# Patient Record
Sex: Female | Born: 1990
Health system: Southern US, Community
[De-identification: ages and names within clinical notes are randomized; demographics above are authoritative.]

## PROBLEM LIST (undated history)

## (undated) DIAGNOSIS — I10 Essential (primary) hypertension: Secondary | ICD-10-CM

## (undated) DIAGNOSIS — I951 Orthostatic hypotension: Secondary | ICD-10-CM

## (undated) DIAGNOSIS — E11649 Type 2 diabetes mellitus with hypoglycemia without coma: Secondary | ICD-10-CM

## (undated) DIAGNOSIS — E109 Type 1 diabetes mellitus without complications: Secondary | ICD-10-CM

## (undated) DIAGNOSIS — E049 Nontoxic goiter, unspecified: Secondary | ICD-10-CM

## (undated) DIAGNOSIS — E063 Autoimmune thyroiditis: Secondary | ICD-10-CM

## (undated) DIAGNOSIS — E1042 Type 1 diabetes mellitus with diabetic polyneuropathy: Secondary | ICD-10-CM

## (undated) DIAGNOSIS — G930 Cerebral cysts: Secondary | ICD-10-CM

## (undated) HISTORY — PX: OTHER SURGICAL HISTORY: SHX169

## (undated) HISTORY — DX: Cerebral cysts: G93.0

## (undated) HISTORY — DX: Nontoxic goiter, unspecified: E04.9

## (undated) HISTORY — DX: Type 1 diabetes mellitus without complications: E10.9

## (undated) HISTORY — DX: Type 2 diabetes mellitus with hypoglycemia without coma: E11.649

## (undated) HISTORY — DX: Autoimmune thyroiditis: E06.3

## (undated) HISTORY — DX: Essential (primary) hypertension: I10

## (undated) HISTORY — DX: Orthostatic hypotension: I95.1

## (undated) HISTORY — DX: Type 1 diabetes mellitus with diabetic polyneuropathy: E10.42

---
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
## 2010-05-03 ENCOUNTER — Ambulatory Visit: Payer: Self-pay | Admitting: "Endocrinology

## 2010-07-09 IMAGING — CT CT ABD-PELV W/ CM
2 of 4 series · 16 of 46 positions shown, 18 images · IV contrast (APPLIED)
Comparison: None.

CLINICAL DATA: Right lower quadrant abdominal pain with nausea for
1 day.

CT ABDOMEN AND PELVIS WITH CONTRAST
TECHNIQUE: Multidetector CT imaging of the abdomen and pelvis was
performed following the standard protocol during bolus
administration of intravenous contrast.
Contrast: 100 ml Cmnipaque-GRR intravenously.

[Series 2: abd/pelvis 5.0 b31f · axial · 0.63mm/px · z∈[+826,+1250]mm · 13 of 93 slices shown, 15 images]
[im 4/93  soft-tissue]
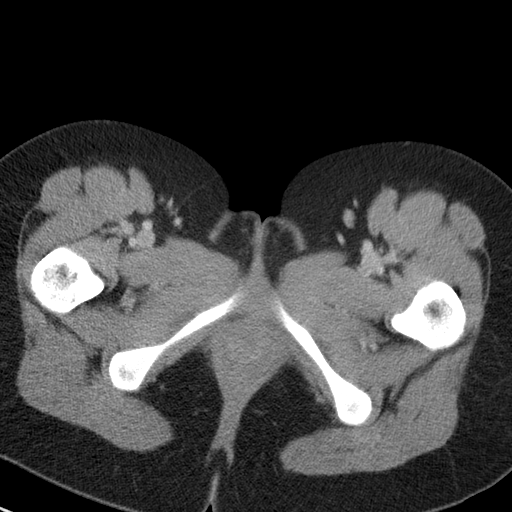
[im 4/93  bone]
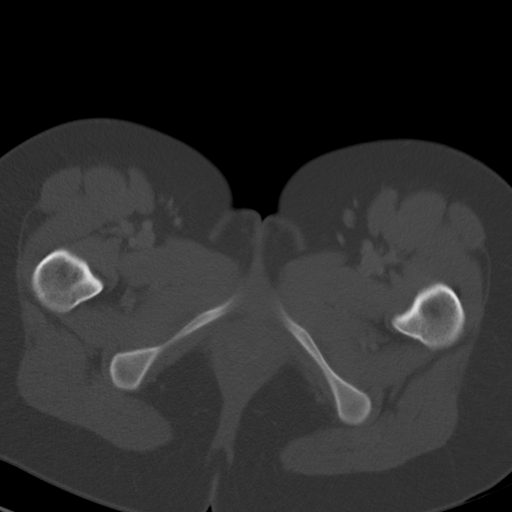
[im 11/93  soft-tissue]
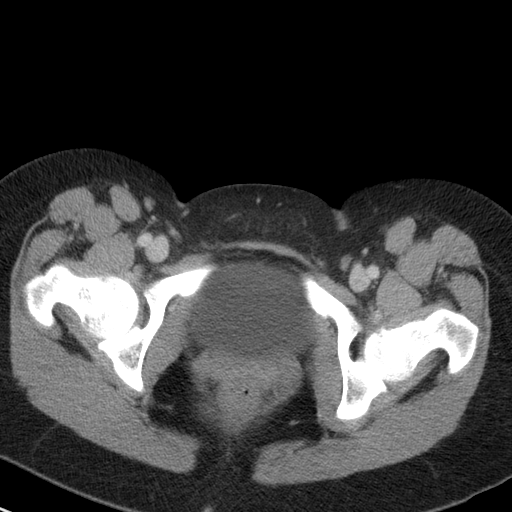
[im 18/93  soft-tissue]
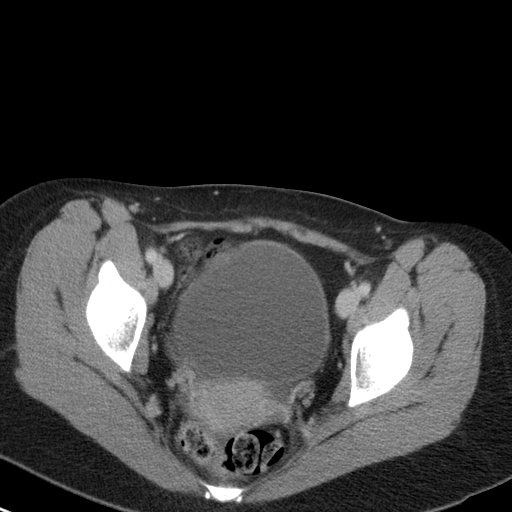
[im 25/93  soft-tissue]
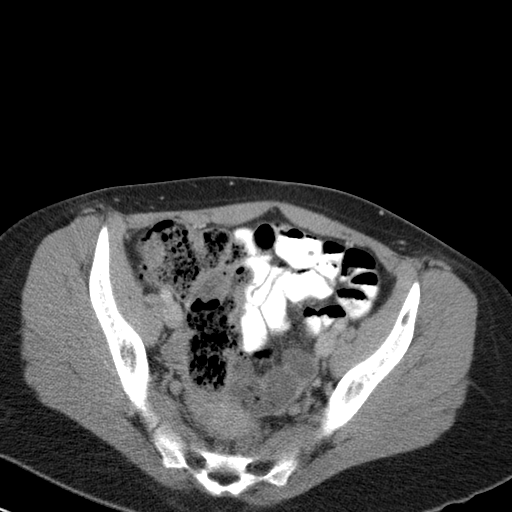
[im 32/93  soft-tissue]
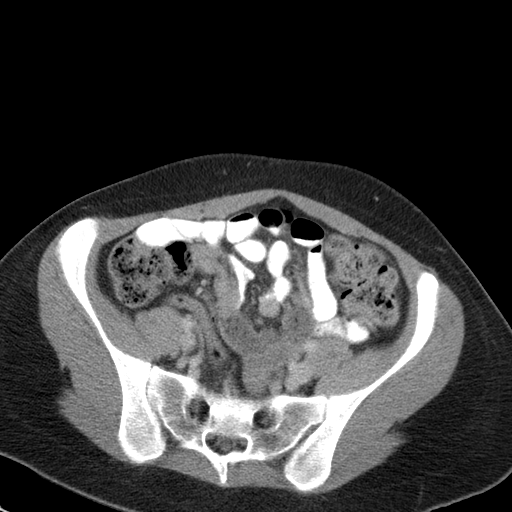
[im 39/93  soft-tissue]
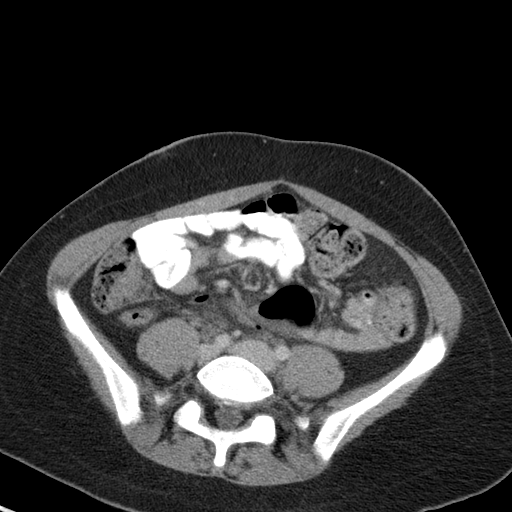
[im 47/93  soft-tissue]
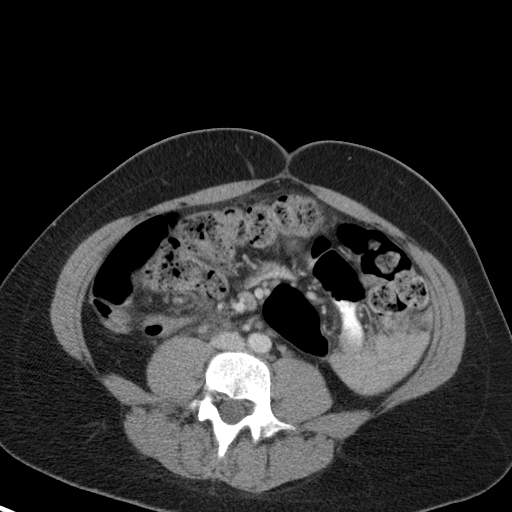
[im 54/93  soft-tissue]
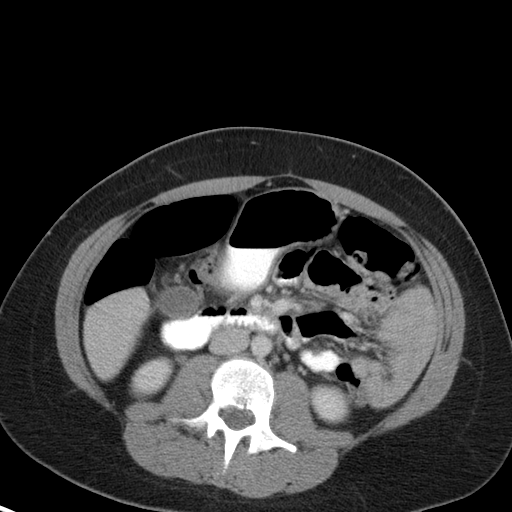
[im 61/93  soft-tissue]
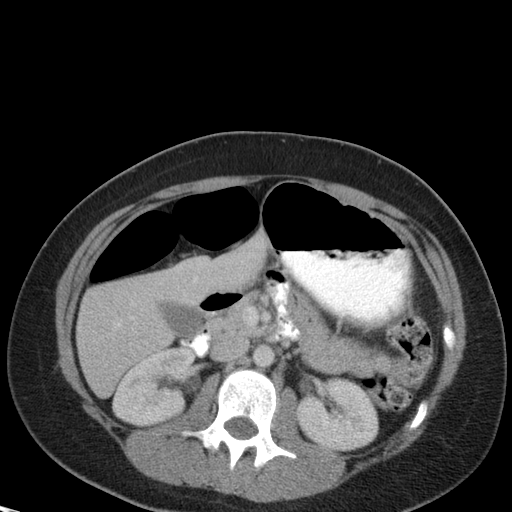
[im 61/93  bone]
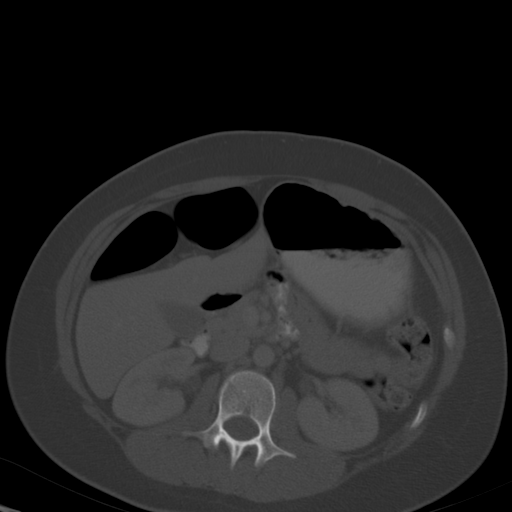
[im 68/93  soft-tissue]
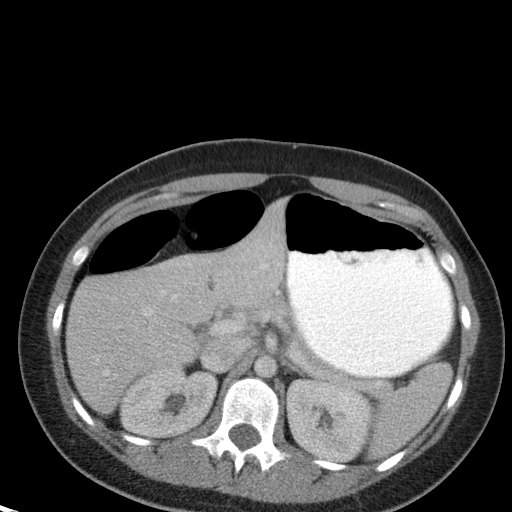
[im 75/93  soft-tissue]
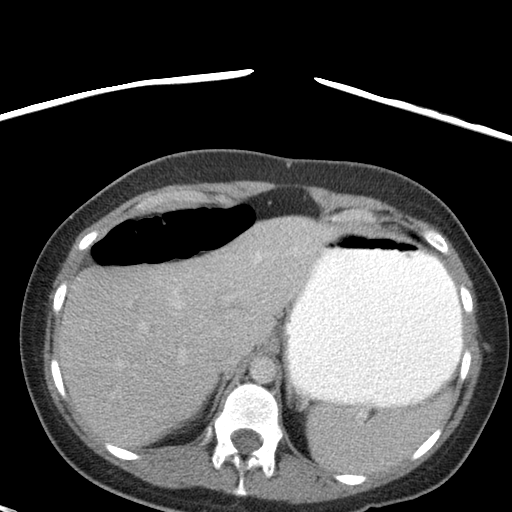
[im 82/93  soft-tissue]
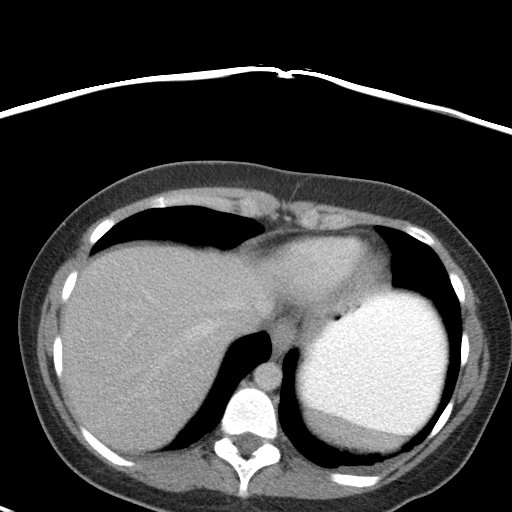
[im 89/93  soft-tissue]
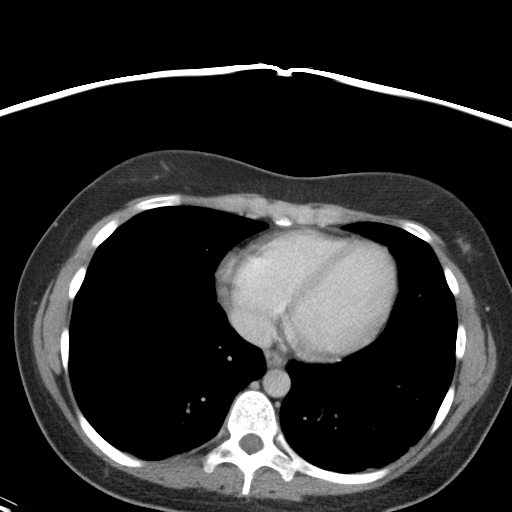

[Series 5: abd/pelvis 3.0 coronal · coronal · 0.70mm/px · 3 of 70 slices shown]
[im 24/70  soft-tissue]
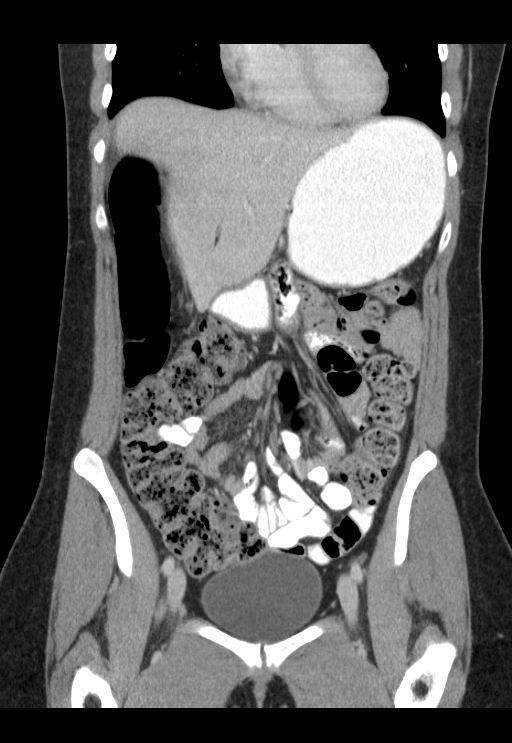
[im 31/70  soft-tissue]
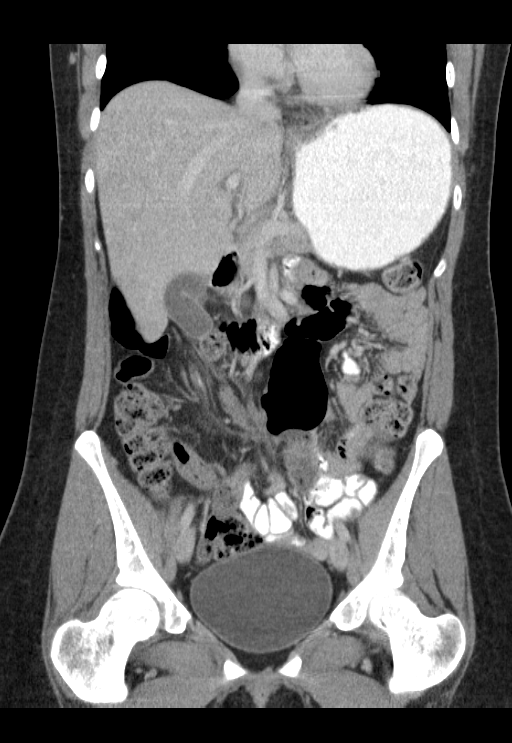
[im 39/70  soft-tissue]
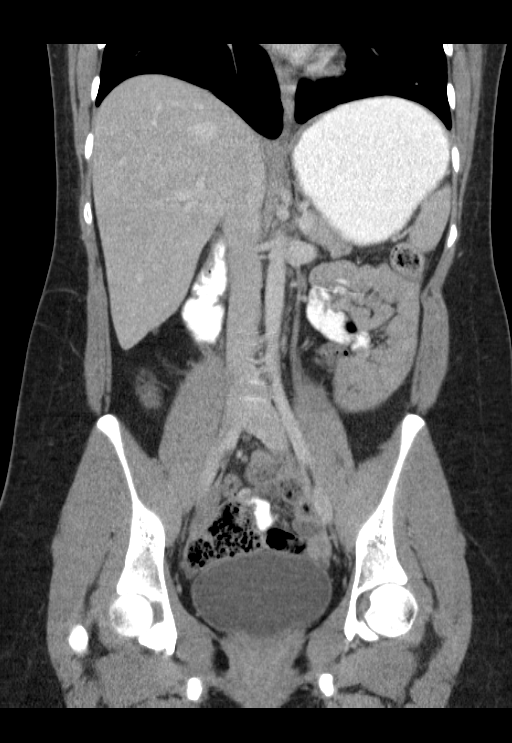

[16 of 46 positions shown; findings below may reference images not displayed]

FINDINGS: The lung bases are clear.  There is no pleural effusion.
The liver, spleen, gallbladder, pancreas and adrenal glands appear
unremarkable.

No renal or ureteral calculi are demonstrated.  There is probable
mild upper pole caliectasis on the right and the mid ureter is
mildly prominent without definite signs of obstruction.  There is
no periureteral inflammatory change.  The urinary bladder is
unremarkable in appearance.

Moderate stool is noted throughout the colon.  The appendix is air-
filled and normal in caliber without surrounding inflammation.  The
uterus and ovaries appear normal.
IMPRESSION: 1.  No evidence of appendicitis or other abdominal pelvic
inflammatory process.
2.  Slight prominence of the right ureter and right upper pole
calices may be physiologic.  No urinary tract calculi are
demonstrated.
3.  Moderate stool throughout the colon suggests constipation.

## 2010-07-27 LAB — URINALYSIS, ROUTINE W REFLEX MICROSCOPIC
Bilirubin Urine: NEGATIVE
Hgb urine dipstick: NEGATIVE
Nitrite: NEGATIVE
Protein, ur: NEGATIVE mg/dL
Specific Gravity, Urine: 1.029 (ref 1.005–1.030)
Urobilinogen, UA: 0.2 mg/dL (ref 0.0–1.0)

## 2010-07-27 LAB — BASIC METABOLIC PANEL
Calcium: 8.9 mg/dL (ref 8.4–10.5)
Creatinine, Ser: 0.8 mg/dL (ref 0.4–1.2)
GFR calc Af Amer: >60 mL/min (ref >60)
GFR calc non Af Amer: >60 mL/min (ref >60)
Sodium: 139 mEq/L (ref 135–145)

## 2010-07-27 LAB — CBC
Platelets: 325 10*3/uL (ref 150–400)
RBC: 4.02 MIL/uL (ref 3.87–5.11)
WBC: 6.3 10*3/uL (ref 4.0–10.5)

## 2010-07-27 LAB — DIFFERENTIAL
Eosinophils Absolute: 0.1 10*3/uL (ref 0.0–0.7)
Lymphocytes Relative: 26 % (ref 12–46)
Lymphs Abs: 1.6 10*3/uL (ref 0.7–4.0)
Neutro Abs: 4.2 10*3/uL (ref 1.7–7.7)
Neutrophils Relative %: 65 % (ref 43–77)

## 2010-07-27 LAB — PREGNANCY, URINE: Preg Test, Ur: NEGATIVE

## 2010-07-27 LAB — GC/CHLAMYDIA PROBE AMP, GENITAL
Chlamydia, DNA Probe: NEGATIVE
GC Probe Amp, Genital: NEGATIVE

## 2010-07-27 LAB — WET PREP, GENITAL: Yeast Wet Prep HPF POC: NONE SEEN

## 2010-07-30 LAB — URINALYSIS, ROUTINE W REFLEX MICROSCOPIC
Hgb urine dipstick: NEGATIVE
Protein, ur: NEGATIVE mg/dL
Urobilinogen, UA: 0.2 mg/dL (ref 0.0–1.0)

## 2010-07-30 LAB — COMPREHENSIVE METABOLIC PANEL
AST: 24 U/L (ref 0–37)
Albumin: 3.7 g/dL (ref 3.5–5.2)
Calcium: 9.3 mg/dL (ref 8.4–10.5)
Chloride: 105 mEq/L (ref 96–112)
Creatinine, Ser: 0.7 mg/dL (ref 0.4–1.2)
GFR calc Af Amer: >60 mL/min (ref >60)

## 2010-07-30 LAB — GLUCOSE, CAPILLARY: Glucose-Capillary: 126 mg/dL — ABNORMAL HIGH (ref 70–99)

## 2010-07-30 LAB — DIFFERENTIAL
Basophils Relative: 1 % (ref 0–1)
Eosinophils Relative: 1 % (ref 0–5)
Lymphocytes Relative: 19 % (ref 12–46)
Lymphs Abs: 1.3 10*3/uL (ref 0.7–4.0)
Monocytes Absolute: 0.2 10*3/uL (ref 0.1–1.0)
Neutro Abs: 5.1 10*3/uL (ref 1.7–7.7)
Neutrophils Relative %: 76 % (ref 43–77)

## 2010-07-30 LAB — CBC
MCV: 87.3 fL (ref 78.0–100.0)
Platelets: 327 10*3/uL (ref 150–400)
WBC: 6.7 10*3/uL (ref 4.0–10.5)

## 2010-07-30 LAB — PREGNANCY, URINE: Preg Test, Ur: NEGATIVE

## 2010-07-30 LAB — GC/CHLAMYDIA PROBE AMP, GENITAL
Chlamydia, DNA Probe: NEGATIVE
GC Probe Amp, Genital: NEGATIVE

## 2010-07-30 LAB — WET PREP, GENITAL
Clue Cells Wet Prep HPF POC: NONE SEEN
Trich, Wet Prep: NONE SEEN

## 2010-08-21 ENCOUNTER — Other Ambulatory Visit: Payer: Self-pay | Admitting: *Deleted

## 2010-08-21 ENCOUNTER — Encounter: Payer: Self-pay | Admitting: *Deleted

## 2010-09-11 IMAGING — US US PELVIS COMPLETE
1 series · 14 of 15 positions shown · non-contrast
Comparison: None.

CLINICAL DATA: History of right-sided pain.  History of diabetes.

TRANSABDOMINAL ULTRASOUND OF PELVIS
TECHNIQUE: Transabdominal ultrasound examination of the pelvis was
performed including evaluation of the uterus, ovaries, adnexal
regions, and pelvic cul-de-sac.

[Series 1: us pelvis complete · 0.30mm/px · 14 of 15 slices shown]
[im 1/15]
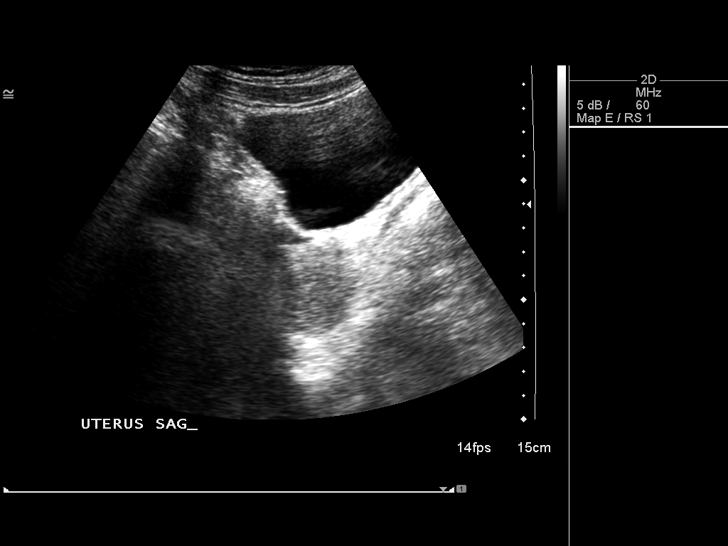
[im 2/15]
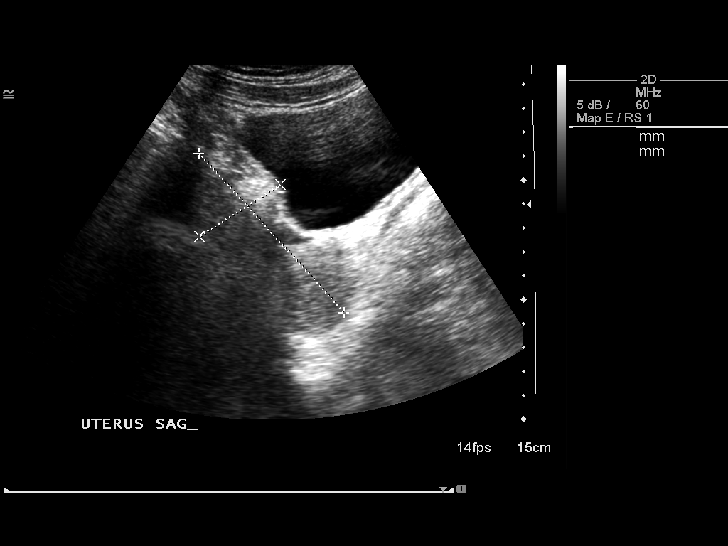
[im 3/15]
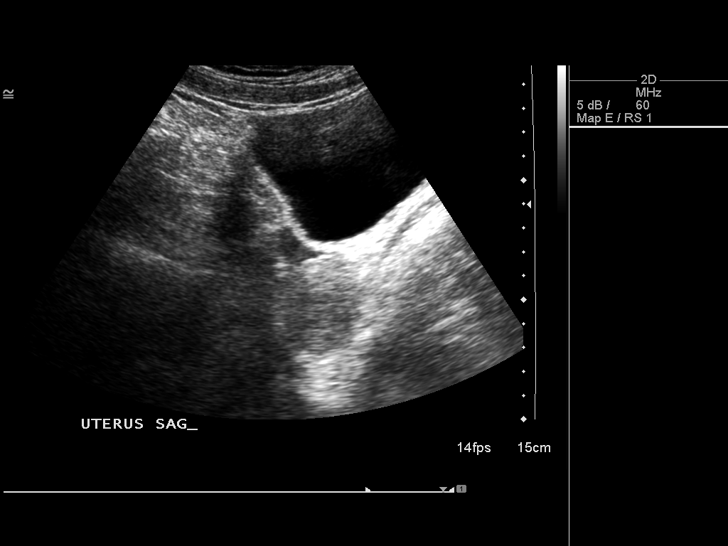
[im 4/15]
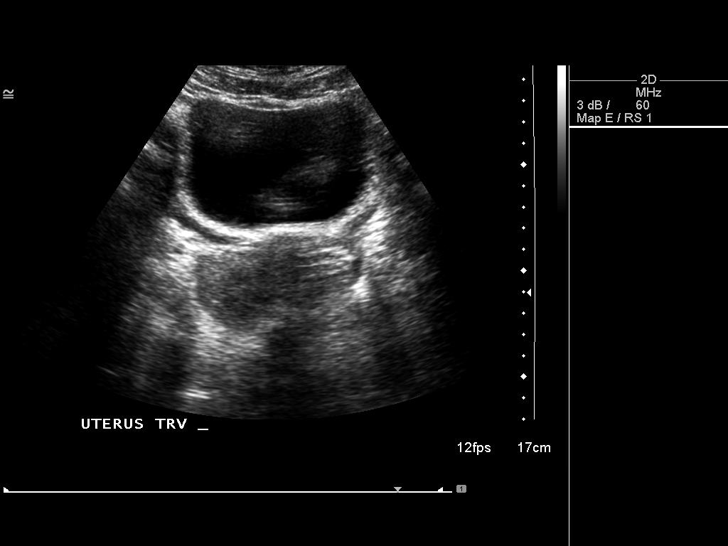
[im 5/15]
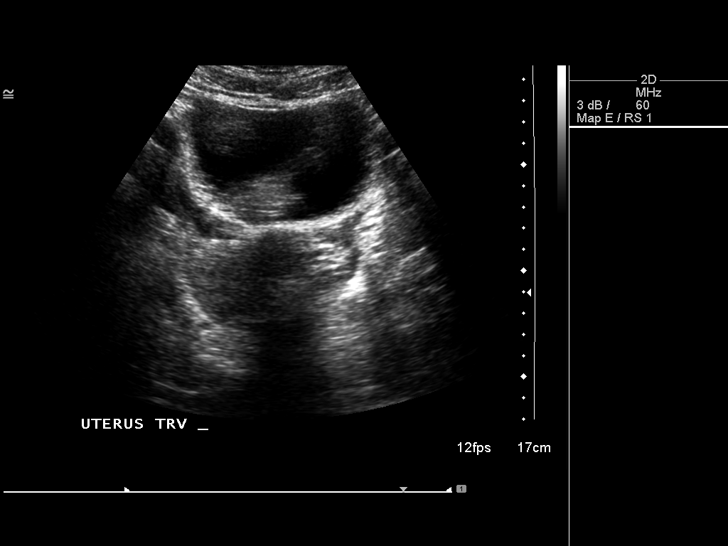
[im 6/15]
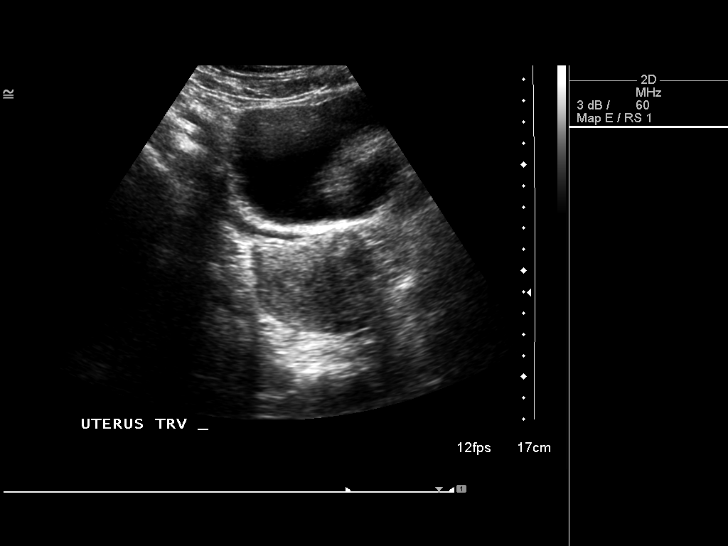
[im 7/15]
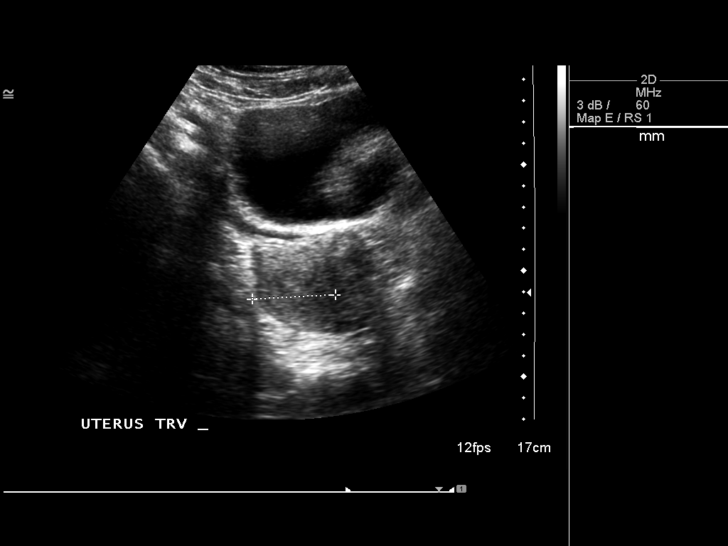
[im 9/15]
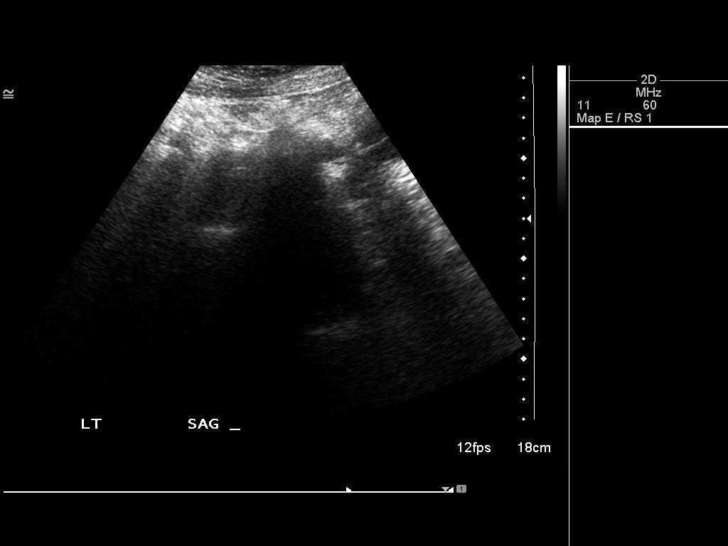
[im 10/15]
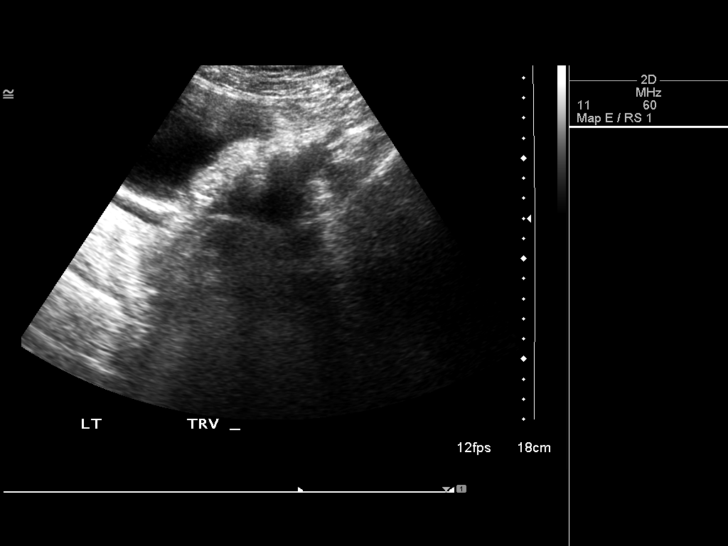
[im 11/15]
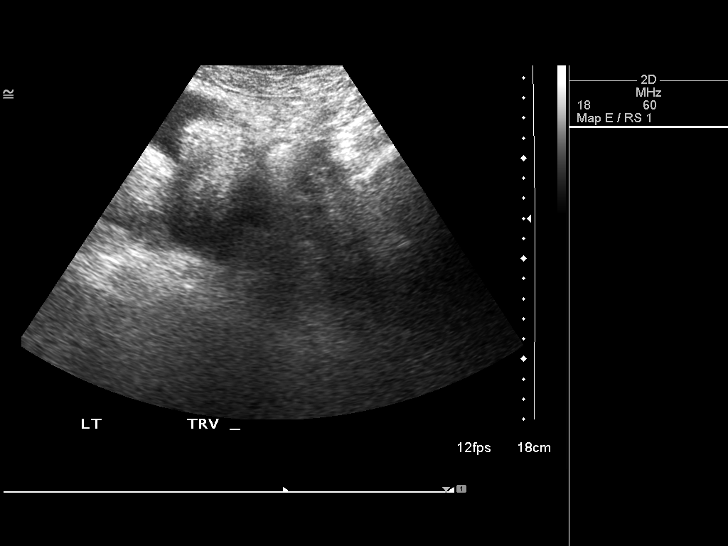
[im 12/15]
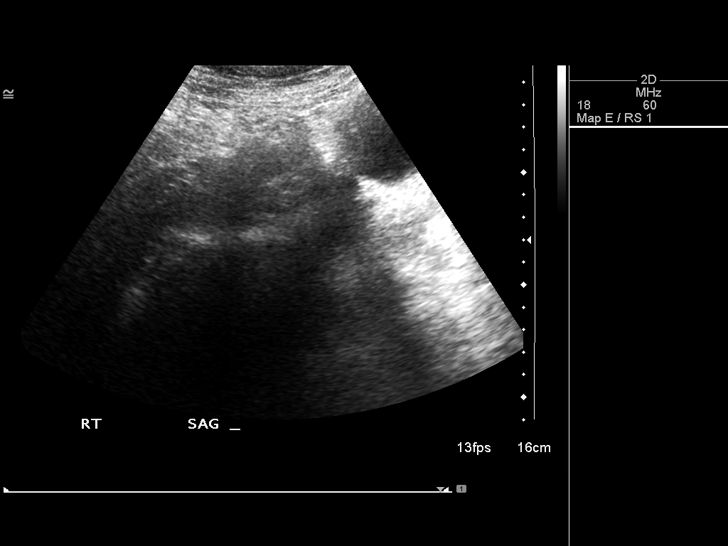
[im 13/15]
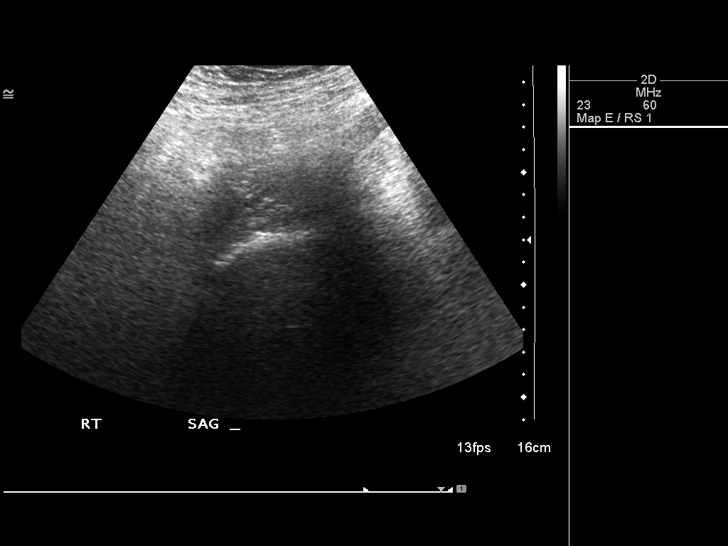
[im 14/15]
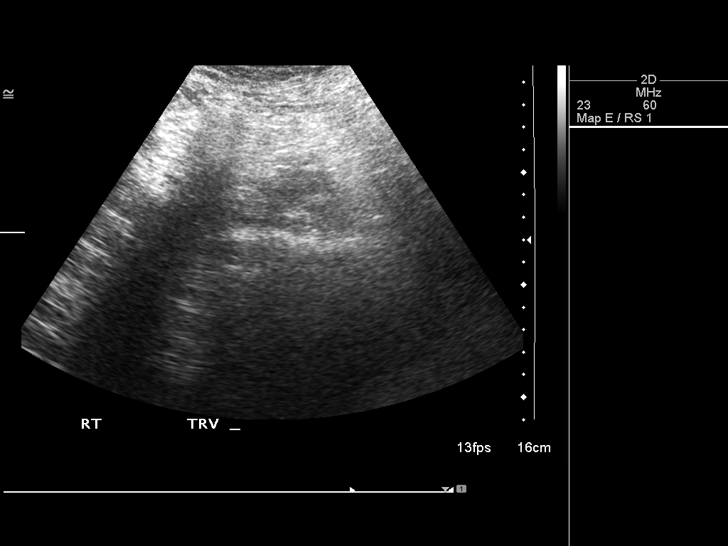
[im 15/15]
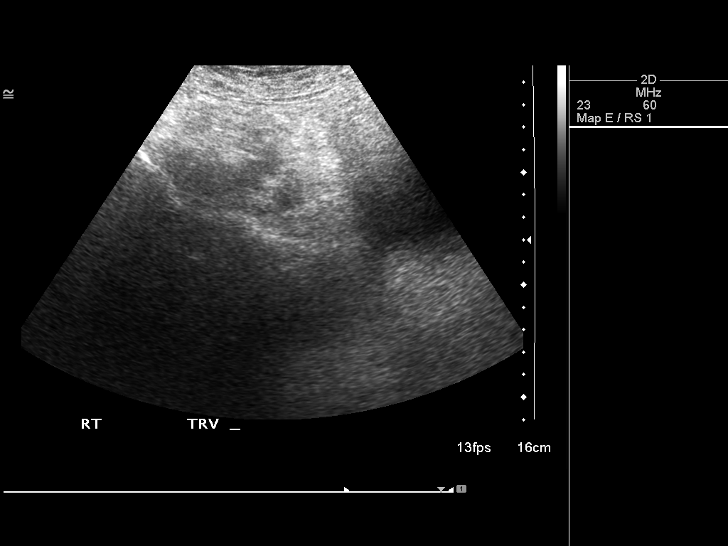

[14 of 15 positions shown; findings below may reference images not displayed]

FINDINGS: Study was compromised by fecal material and bowel gas.

Uterus measures 9.0 x 4.0 x 3.9 cm.  No myometrial lesion was
demonstrated.

Endometrium :  Endometrial stripe cannot be visualized.  It does
not appear thickened.

Right Ovary :  Right ovary cannot be imaged.

Left Ovary :  Left ovary cannot be imaged.

Other Findings:  No free fluid is seen in the pelvis.
IMPRESSION: Examination was compromised by bowel gas and fecal material.  No
pelvic pathology was demonstrated.  The ovaries cannot be imaged.

## 2010-09-17 ENCOUNTER — Ambulatory Visit (INDEPENDENT_AMBULATORY_CARE_PROVIDER_SITE_OTHER): Payer: BC Managed Care – PPO | Admitting: "Endocrinology

## 2010-09-17 DIAGNOSIS — E1142 Type 2 diabetes mellitus with diabetic polyneuropathy: Secondary | ICD-10-CM

## 2010-09-17 DIAGNOSIS — IMO0002 Reserved for concepts with insufficient information to code with codable children: Secondary | ICD-10-CM

## 2010-09-17 DIAGNOSIS — E1065 Type 1 diabetes mellitus with hyperglycemia: Secondary | ICD-10-CM

## 2010-09-17 DIAGNOSIS — I10 Essential (primary) hypertension: Secondary | ICD-10-CM

## 2010-09-17 DIAGNOSIS — E785 Hyperlipidemia, unspecified: Secondary | ICD-10-CM

## 2010-09-20 ENCOUNTER — Other Ambulatory Visit: Payer: Self-pay | Admitting: "Endocrinology

## 2010-09-20 LAB — COMPREHENSIVE METABOLIC PANEL
AST: 14 U/L (ref 0–37)
Albumin: 3.9 g/dL (ref 3.5–5.2)
Alkaline Phosphatase: 81 U/L (ref 39–117)
BUN: 14 mg/dL (ref 6–23)
Potassium: 4.3 mEq/L (ref 3.5–5.3)
Sodium: 139 mEq/L (ref 135–145)
Total Protein: 7.3 g/dL (ref 6.0–8.3)

## 2010-09-20 LAB — LIPID PANEL
HDL: 76 mg/dL (ref >39)
LDL Cholesterol: 123 mg/dL — ABNORMAL HIGH (ref 0–99)
VLDL: 41 mg/dL — ABNORMAL HIGH (ref 0–40)

## 2010-09-21 LAB — MICROALBUMIN / CREATININE URINE RATIO
Creatinine, Urine: 246.9 mg/dL
Microalb, Ur: 0.88 mg/dL (ref 0.00–1.89)

## 2010-09-21 LAB — THYROID PEROXIDASE ANTIBODY: Thyroperoxidase Ab SerPl-aCnc: 34.9 IU/mL (ref <35.0)

## 2010-09-27 ENCOUNTER — Telehealth: Payer: Self-pay | Admitting: *Deleted

## 2010-09-27 NOTE — Telephone Encounter (Signed)
Lab results per Dr. Fransico Michael: 1.   TFT levels have been bouncing around from low to normal to high normal.  This is consistent with evolving Hashimoto's Disease.  Brief explanation given.  https://www.west.net/  Best website to look it up. 2.   Total Cholesterol 240;  Up from 178 in 2010. 3.   Triglycerides 206 still high but down from 241 in 2010. 4.   HDL  76 normal;  71 in 2010 5.   LDL 123; up from 97 in 2010; needs to be < 100. 6.   Urine Protein is normal. 7.   Previously Victoria Atkinson was not exercising, but getting ready to start.    8.   She needs to increase her exercise, or she'll need to start cholesterol lowering meds. 9.   If questions, please feel free to contact Dr. Fransico Michael next week.   Leave him a voice mail. 10. Follow-up as planned.

## 2011-04-09 ENCOUNTER — Other Ambulatory Visit: Payer: Self-pay | Admitting: *Deleted

## 2011-04-09 DIAGNOSIS — IMO0002 Reserved for concepts with insufficient information to code with codable children: Secondary | ICD-10-CM

## 2011-04-09 DIAGNOSIS — E1065 Type 1 diabetes mellitus with hyperglycemia: Secondary | ICD-10-CM

## 2011-04-17 LAB — T4, FREE: Free T4: 1.16 ng/dL (ref 0.80–1.80)

## 2011-04-17 LAB — LIPID PANEL
LDL Cholesterol: 98 mg/dL (ref 0–99)
Total CHOL/HDL Ratio: 2.9 Ratio

## 2011-04-18 LAB — MICROALBUMIN / CREATININE URINE RATIO
Creatinine, Urine: 456 mg/dL
Microalb, Ur: 0.96 mg/dL (ref 0.00–1.89)

## 2011-04-24 ENCOUNTER — Encounter: Payer: Self-pay | Admitting: "Endocrinology

## 2011-04-24 ENCOUNTER — Ambulatory Visit (INDEPENDENT_AMBULATORY_CARE_PROVIDER_SITE_OTHER): Payer: BC Managed Care – PPO | Admitting: "Endocrinology

## 2011-04-24 VITALS — BP 127/84 | HR 114 | Wt 160.0 lb

## 2011-04-24 DIAGNOSIS — E1169 Type 2 diabetes mellitus with other specified complication: Secondary | ICD-10-CM

## 2011-04-24 DIAGNOSIS — E063 Autoimmune thyroiditis: Secondary | ICD-10-CM

## 2011-04-24 DIAGNOSIS — I10 Essential (primary) hypertension: Secondary | ICD-10-CM

## 2011-04-24 DIAGNOSIS — E1065 Type 1 diabetes mellitus with hyperglycemia: Secondary | ICD-10-CM

## 2011-04-24 DIAGNOSIS — E11649 Type 2 diabetes mellitus with hypoglycemia without coma: Secondary | ICD-10-CM

## 2011-04-24 DIAGNOSIS — E1142 Type 2 diabetes mellitus with diabetic polyneuropathy: Secondary | ICD-10-CM

## 2011-04-24 DIAGNOSIS — E1149 Type 2 diabetes mellitus with other diabetic neurological complication: Secondary | ICD-10-CM

## 2011-04-24 DIAGNOSIS — E049 Nontoxic goiter, unspecified: Secondary | ICD-10-CM

## 2011-04-24 DIAGNOSIS — G609 Hereditary and idiopathic neuropathy, unspecified: Secondary | ICD-10-CM

## 2011-04-24 LAB — POCT GLYCOSYLATED HEMOGLOBIN (HGB A1C): Hemoglobin A1C: 5.8

## 2011-04-24 LAB — GLUCOSE, POCT (MANUAL RESULT ENTRY): POC Glucose: 174

## 2011-04-24 NOTE — Progress Notes (Signed)
Subjective:  Patient Name: Victoria Atkinson Date of Birth: 1991-04-24  MRN: [id number]  Victoria Atkinson  presents to the office today for follow-up of her type 1 diabetes mellitus, goiter, hypoglycemia, peripheral neuropathy, headaches, Hashimoto's disease, and hypertension.  HISTORY OF PRESENT ILLNESS:   Victoria Atkinson is a 20 y.o. Caucasian young woman.  Victoria Atkinson was unaccompanied.   1. The patient was first referred to me on 10/31/04 by Dr. Dian Situ from [facility] for evaluation and management of new onset type 1 diabetes mellitus. The patient was 20 years old.   A. The patient had about a 2 month period of polyuria, polydipsia, nocturia, upset stomach, nausea, stomach pains, and a documented 17 pound weight loss. She was often dizzy and lightheaded. At her PCP's office a serum glucose was 357. A serum CO2 was 26. I fit the patient in to see me that day. 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 Family history was positive for a cousin with type 1 diabetes mellitus, a maternal grandmother with a "sluggish" thyroid, and a maternal aunt who took thyroid medicine. On physical examination her height was 65-1/2 inches. She weighed 99-1/2 pounds. Her BMI was 16.4. Her CBG was high, which was greater than 500 on our meter. Her hemoglobin A1c was 12.6%.  B. We provided diabetes education to the patient and her family in our office. I started her on NovoLog by sliding scale. I subsequently changed the NovoLog to our two-componentnent method with a correction dose and food dose at mealtimes. I also added Lantus insulin. 2. During the past six years, the patient has done well overall. She had a good honeymoon period that lasted about 9 months, during which her hemoglobin A1c's varied from 6.0-6.3%. On 08/23/2005 we started her on insulin pump therapy with a Medtronic Paradigm 722 insulin pump. Since then, her hemoglobin A1c values have varied from 6.5-8.3%.  3. On  10/21/2005, as part of an evaluation for headaches, an MRI was performed. A 3.1 x 6.2 x 11.0 cm posterior fossa arachnoid cyst was noted. Subsequent evaluation by neurosurgeons at [facility] and at [facility] felt that she had a mega cisterna magna rather than a cyst. This was considered to be normal variant and was not felt to be causing her headaches. She was told that surgery would not be required. 4. The patient's last [facility] visit was on 09/17/10. In the interim, her blood sugars have been higher than usual for the past several weeks. She's had 2 weeks of dizziness. The dizziness is mostly lightheaded. She gets dizzy when she stands up or sits up. She has also noted another problem. When she is playing the oboe, she may lose feeling in her right hand, right upper arm, and right shoulder. It seems as if when she holds her breath in order to play the oboe, she will lose feeling in her hand for a few seconds. This problem becomes worse if she is standing. 5. Pertinent Review of Systems:  Constitutional: The patient feels "fine". She has been healthy.  Eyes: Vision is good with her glasses. Her last exam was in January 2012. She is already scheduled for followup exam in January 2013. There are no significant eye complaints. Neck: The patient has no complaints of anterior neck swelling, soreness, tenderness,  pressure, discomfort, or difficulty swallowing.  Heart: Heart rate increases with exercise or other physical activity. The patient has no complaints of palpitations, irregular heat beats,  chest pain, or chest pressure. Gastrointestinal: Bowel movents seem normal. The patient has no complaints of excessive hunger, acid reflux, upset stomach, stomach aches or pains, diarrhea, or constipation. Legs: Muscle mass and strength seem normal. There are no complaints of numbness, tingling, burning, or pain. No edema is noted. Feet: There are no obvious foot problems. There are no  complaints of numbness, tingling, burning, or pain. No edema is noted. GYN: She remains on oral contraceptive pills. Hypoglycemia: This occurs occasionally. She had 2 episodes last night. Low blood sugars occur more often when she works out, especially later in the evening. 5. BG printout: She is having occasional low blood sugars in the 50s to 60s. Some of her low BGs occured in the evening. She is also having more higher blood sugars that I would expect with a hemoglobin A1c of 5.8%.   PAST MEDICAL, FAMILY, AND SOCIAL HISTORY:  Past Medical History  Diagnosis Date  . Type 1 diabetes mellitus not at goal   . Hypoglycemia associated with diabetes   . Goiter   . DM type 1 with diabetic peripheral neuropathy   . Hashimoto's disease   . Hypertension   . Orthostatic hypotension   . Brain cyst     Family History  Problem Relation Age of Onset  . Thyroid disease Maternal Aunt     Hypothyroid, takes thyroid medicine  . Thyroid disease Maternal Grandmother     Sluggish thyroid  . Cancer Paternal Grandfather   . Diabetes Cousin     T1 DM    Current outpatient prescriptions:drospirenone-ethinyl estradiol (YAZ,GIANVI,LORYNA) 3-0.02 MG tablet, Take 1 tablet by mouth daily.  , Disp: , Rfl: ;  insulin aspart (NOVOLOG) 100 UNIT/ML injection, Inject into the skin. Use with Insulin Pump , Disp: , Rfl: ;  lisinopril (PRINIVIL,ZESTRIL) 2.5 MG tablet, Take 2.5 mg by mouth daily.  , Disp: , Rfl:  lisinopril (PRINIVIL,ZESTRIL) 2.5 MG tablet, Take 1 tablet (2.5 mg total) by mouth daily., Disp: 90 tablet, Rfl: 2  Allergies as of 04/24/2011 - Review Complete 04/24/2011  Allergen Reaction Noted  . Zithromax (azithromycin) Hives 08/21/2010    1. 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 2. Activities: She goes to the gym to work out frequently. 3. Smoking, alcohol, or drugs: None 4. Primary Care Provider: Leanne Chang, MD  ROS: There  are no other significant problems involving Victoria Atkinson's other six body systems.   Objective:  Vital Signs:  BP 127/84  Pulse 114  Wt 160 lb 0.3 oz (72.585 kg)   Ht Readings from Last 3 Encounters:  03/11/07 5\' 8"  (1.727 m) (93.99%*)   * Growth percentiles are based on CDC 2-20 Years data.   Wt Readings from Last 3 Encounters:  04/24/11 160 lb 0.3 oz (72.585 kg)  06/12/07 142 lb 2.1 oz (64.47 kg) (81.19%*)  03/11/07 147 lb 8 oz (66.906 kg) (85.70%*)   * Growth percentiles are based on CDC 2-20 Years data.  Normalized stature-for-age data available only for age 75 to 20 years. Normalized weight-for-age data available only for age 75 to 20 years.   PHYSICAL EXAM:  Constitutional: The patient appears healthy and well nourished.  Face: The face appears normal.  Eyes: There is no obvious arcus or proptosis. Moisture appears normal. Mouth: The oropharynx and tongue appear normal. Oral moisture is normal. Neck: The neck appears to be visibly normal. No carotid bruits are noted. The thyroid gland is  20-25 grams in size. The consistency of the thyroid gland is normal. The thyroid gland is not tender to palpation. Lungs: The lungs are clear to auscultation. Air movement is good. Heart: Heart rate and rhythm are regular. Heart sounds S1 and S2 are normal. I did not appreciate any pathologic cardiac murmurs. Abdomen: The abdomen appears to be normal in size. Bowel sounds are normal. There is no obvious hepatomegaly, splenomegaly, or other mass effect.  Arms: Muscle size and bulk are normal for age. Hands: There is no obvious tremor. Phalangeal and metacarpophalangeal joints are normal. Palmar muscles are normal. Palmar skin is normal. Palmar moisture is also normal. Legs: Muscles appear normal for age. No edema is present. Feet: Feet are normally formed. Dorsalis pedal pulses are 1+ on the right and faint 1+ on the left.. Neurologic: Strength is normal for age in both the upper and lower  extremities. Muscle tone is normal. Sensation to touch is normal in both the legs and feet.    LAB DATA: Hemoglobin A1c was 5.8% today.          Labs 04/09/11: Cholesterol was 206, triglyceride 182, HDL 72, and LDL 98. TSH was 2.7.9, free T4 1.16, and free T3 3.8. Her urinary microalbumin/creatinine ratio is 2.1 (normal less than 30).   Assessment and Plan:   ASSESSMENT:  1. T1DM: She is doing much better overall, but still has a lot of variability at times. She often has to inflate her carb counts because her current ICR settings are too low. 2. Hypoglycemia: While she is not having many low BGs, the ones she does have tend to occur after physical exertion. 3. Hypertension: She needs to take her lisinopril daily. 4. Goiter/thyroiditis/transient hypothyroidism: According to last month's labs, she has bounced back into the lower portion of  the euthroid range.  5. Peripheral neuropathy: With the improvement in blood glucose control, she is not having any signs or symptoms of peripheral neuropathy. 6. Dizziness: Her symptoms appear to be due to orthostatic hypotension, secondary to dehydration, which occurs when her sugars are elevated and cause osmotic diuresis.  PLAN:  1. Diagnostic: Will order new labs at next visit. 2. Therapeutic: Please take your lisinopril every day. We set new Insulin:Carb Ratios: At midnight, 1:12 as is. At 6 AM, 1:5. At 11 AM, 1:8. 3. Patient education: We discussed the issue of hyperglyemia causing dehydration, causing orthostatic dizziness.  She is not a compulsive water/fluid drinker by nature, so she has to work on this area. 4. Follow-up: Return in about 3 months (around 07/23/2011).  Level of Service: This visit lasted in excess of 40 minutes. More than 50% of the visit was devoted to counseling.  [name] Stall, MD 06/29/2011 4:14 PM

## 2011-04-24 NOTE — Progress Notes (Deleted)
Subjective:  Patient Name: Victoria Atkinson Date of Birth: 16-May-1990  MRN: [id number]  Victoria Atkinson  presents to the office today for follow-up of her No chief complaint on file.   HISTORY OF PRESENT ILLNESS:   Victoria Atkinson is a 20 y.o. Caucasian young woman. Victoria Atkinson was unaccompanied.  1. ***   2. The patient's last [facility] visit was on 09/17/10. In the interim, she has generally been healthy. For about a week prior to Thanksgiving, however, her BGs wre high and sh had trouble getting them  3. Pertinent Review of Systems:   Constitutional: The patient feels well, is healthy, and has no significant complaints. Eyes: Vision is good. There are no significant eye complaints. Neck: The patient has no complaints of anterior neck swelling, soreness, tenderness,  pressure, discomfort, or difficulty swallowing.  Heart: Heart rate increases with exercise or other physical activity. The patient has no complaints of palpitations, irregular heat beats, chest pain, or chest pressure. Gastrointestinal: Bowel movents seem normal. The patient has no complaints of excessive hunger, acid reflux, upset stomach, stomach aches or pains, diarrhea, or constipation. Legs: Muscle mass and strength seem normal. There are no complaints of numbness, tingling, burning, or pain. No edema is noted. Feet: There are no obvious foot problems. There are no complaints of numbness, tingling, burning, or pain. No edema is noted. GYN/GU:   PAST MEDICAL, FAMILY, AND SOCIAL HISTORY:  No past medical history on file.  No family history on file.  Current outpatient prescriptions:drospirenone-ethinyl estradiol (YAZ,GIANVI,LORYNA) 3-0.02 MG tablet, Take 1 tablet by mouth daily.  , Disp: , Rfl: ;  insulin aspart (NOVOLOG) 100 UNIT/ML injection, Inject into the skin. Use with Insulin Pump , Disp: , Rfl: ;  lisinopril (PRINIVIL,ZESTRIL) 2.5 MG tablet, Take 2.5 mg by mouth daily.  , Disp: , Rfl:   Allergies as of 04/24/2011 - Review Complete  04/24/2011  Allergen Reaction Noted  . Zithromax (azithromycin) Hives 08/21/2010    1. Work and Family:*** 2. Activities: *** 3. Smoking, alcohol, or drugs: *** 4. Primary Care Provider: Leanne Chang, MD  ROS: There are no other significant problems involving Victoria Atkinson's other six body systems.   Objective:  Vital Signs:  BP 127/84  Pulse 114  Wt 160 lb 0.3 oz (72.585 kg)   Ht Readings from Last 3 Encounters:  03/11/07 5\' 8"  (1.727 m) (93.99%*)   * Growth percentiles are based on CDC 2-20 Years data.   Wt Readings from Last 3 Encounters:  04/24/11 160 lb 0.3 oz (72.585 kg)  06/12/07 142 lb 2.1 oz (64.47 kg) (81.19%*)  03/11/07 147 lb 8 oz (66.906 kg) (85.70%*)   * Growth percentiles are based on CDC 2-20 Years data.   HC Readings from Last 3 Encounters:  No data found for [facility]   There is no height on file to calculate BSA.  Normalized stature-for-age data available only for age 71 to 20 years. Normalized weight-for-age data available only for age 71 to 20 years.   PHYSICAL EXAM:  Constitutional: The patient appears healthy and well nourished.  Face: The face appears normal.  Eyes: There is no obvious arcus or proptosis. Moisture appears normal. Mouth: The oropharynx and tongue appear normal. Dentition appears to be normal for age. Oral moisture is normal. Neck: The neck appears to be visibly normal. No carotid bruits are noted. The thyroid gland is *** grams in size. The consistency of the thyroid gland is *** normal/soft/firm/lobulated. The thyroid gland is not tender to palpation. Lungs: The lungs are clear  to auscultation. Air movement is good. Heart: Heart rate and rhythm are regular.Heart sounds S1 and S2 are normal. I did not appreciate any pathologic cardiac murmurs. Abdomen: The abdomen appears to be normal in size. Bowel sounds are normal. There is no obvious hepatomegaly, splenomegaly, or other mass effect.  Arms: Muscle size and bulk are normal for age. Hands:  There is no obvious tremor. Phalangeal and metacarpophalangeal joints are normal. Palmar muscles are normal. Palmar skin is normal. Palmar moisture is also normal. Legs: Muscles appear normal for age. No edema is present. Feet: Feet are normally formed. Dorsalis pedal pulses are normal. Neurologic: Strength is normal for age in both the upper and lower extremities. Muscle tone is normal. Sensation to touch is normal in both the legs and feet.   GYN/GU: ***  LAB DATA:  Recent Results (from the past 504 hour(s))  TSH   Collection Time   04/09/11 11:41 AM      Component Value Range   TSH 2.749  0.350 - 4.500 (uIU/mL)  T3, FREE   Collection Time   04/09/11 11:41 AM      Component Value Range   T3, Free 3.8  2.3 - 4.2 (pg/mL)  T4, FREE   Collection Time   04/09/11 11:41 AM      Component Value Range   Free T4 1.16  0.80 - 1.80 (ng/dL)  LIPID PANEL   Collection Time   04/09/11 11:41 AM      Component Value Range   Cholesterol 206 (*) 0 - 200 (mg/dL)   Triglycerides 161 (*) <150 (mg/dL)   HDL 72  >09 (mg/dL)   Total CHOL/HDL Ratio 2.9     VLDL 36  0 - 40 (mg/dL)   LDL Cholesterol 98  0 - 99 (mg/dL)  MICROALBUMIN / CREATININE URINE RATIO   Collection Time   04/09/11 11:41 AM      Component Value Range   Microalb, Ur 0.96  0.00 - 1.89 (mg/dL)   Creatinine, Urine 604.5     Microalb Creat Ratio 2.1  0.0 - 30.0 (mg/g)  GLUCOSE, POCT (MANUAL RESULT ENTRY)   Collection Time   04/24/11 10:30 AM      Component Value Range   POC Glucose 174    POCT GLYCOSYLATED HEMOGLOBIN (HGB A1C)   Collection Time   04/24/11 10:32 AM      Component Value Range   Hemoglobin A1C 5.8       Assessment and Plan:   ASSESSMENT:  1. *** 2. *** 3. *** 4. *** 5. ***  PLAN:  1. Diagnostic: *** 2. Therapeutic: *** 3. Patient education: *** 4. Follow-up: Return in about 3 months (around 07/23/2011).  Victoria Stall, MD 04/24/2011 11:51 AM

## 2011-04-24 NOTE — Patient Instructions (Signed)
Followup visit with me in about 3 months. New insulin carbohydrate ratios are as follows: At midnight, one unit of insulin for every 12 g of carbohydrates. At 6 AM, one unit for every 5 g of carbohydrates. At 11 AM, one unit for every 8 g of carbohydrates. Please drink more.

## 2011-05-08 ENCOUNTER — Other Ambulatory Visit: Payer: Self-pay | Admitting: "Endocrinology

## 2011-06-29 ENCOUNTER — Encounter: Payer: Self-pay | Admitting: "Endocrinology

## 2011-06-29 DIAGNOSIS — I10 Essential (primary) hypertension: Secondary | ICD-10-CM | POA: Insufficient documentation

## 2011-06-29 DIAGNOSIS — I951 Orthostatic hypotension: Secondary | ICD-10-CM | POA: Insufficient documentation

## 2011-06-29 DIAGNOSIS — E063 Autoimmune thyroiditis: Secondary | ICD-10-CM | POA: Insufficient documentation

## 2011-06-29 DIAGNOSIS — E11649 Type 2 diabetes mellitus with hypoglycemia without coma: Secondary | ICD-10-CM | POA: Insufficient documentation

## 2011-06-29 DIAGNOSIS — E109 Type 1 diabetes mellitus without complications: Secondary | ICD-10-CM | POA: Insufficient documentation

## 2011-06-29 DIAGNOSIS — G930 Cerebral cysts: Secondary | ICD-10-CM | POA: Insufficient documentation

## 2011-06-29 DIAGNOSIS — E049 Nontoxic goiter, unspecified: Secondary | ICD-10-CM | POA: Insufficient documentation

## 2011-07-01 ENCOUNTER — Telehealth: Payer: Self-pay | Admitting: *Deleted

## 2011-07-01 ENCOUNTER — Ambulatory Visit: Payer: BC Managed Care – PPO | Admitting: "Endocrinology

## 2011-07-01 NOTE — Telephone Encounter (Signed)
LVM for patient to call office regarding PCP information per Dr. Fransico Michael. Louretta Parma, RN

## 2011-07-04 ENCOUNTER — Ambulatory Visit: Payer: BC Managed Care – PPO | Admitting: "Endocrinology

## 2011-07-12 ENCOUNTER — Other Ambulatory Visit: Payer: Self-pay | Admitting: "Endocrinology

## 2011-07-25 ENCOUNTER — Ambulatory Visit: Payer: BC Managed Care – PPO | Admitting: "Endocrinology

## 2011-10-14 ENCOUNTER — Ambulatory Visit: Payer: BC Managed Care – PPO | Admitting: "Endocrinology

## 2011-12-25 ENCOUNTER — Encounter: Payer: Self-pay | Admitting: "Endocrinology

## 2011-12-25 ENCOUNTER — Ambulatory Visit (INDEPENDENT_AMBULATORY_CARE_PROVIDER_SITE_OTHER): Payer: BC Managed Care – PPO | Admitting: "Endocrinology

## 2011-12-25 VITALS — BP 123/77 | HR 98 | Wt 164.7 lb

## 2011-12-25 DIAGNOSIS — E1169 Type 2 diabetes mellitus with other specified complication: Secondary | ICD-10-CM

## 2011-12-25 DIAGNOSIS — E1142 Type 2 diabetes mellitus with diabetic polyneuropathy: Secondary | ICD-10-CM

## 2011-12-25 DIAGNOSIS — E038 Other specified hypothyroidism: Secondary | ICD-10-CM

## 2011-12-25 DIAGNOSIS — E1143 Type 2 diabetes mellitus with diabetic autonomic (poly)neuropathy: Secondary | ICD-10-CM

## 2011-12-25 DIAGNOSIS — I951 Orthostatic hypotension: Secondary | ICD-10-CM

## 2011-12-25 DIAGNOSIS — E063 Autoimmune thyroiditis: Secondary | ICD-10-CM

## 2011-12-25 DIAGNOSIS — E11649 Type 2 diabetes mellitus with hypoglycemia without coma: Secondary | ICD-10-CM

## 2011-12-25 DIAGNOSIS — I10 Essential (primary) hypertension: Secondary | ICD-10-CM

## 2011-12-25 DIAGNOSIS — E1149 Type 2 diabetes mellitus with other diabetic neurological complication: Secondary | ICD-10-CM

## 2011-12-25 DIAGNOSIS — E1042 Type 1 diabetes mellitus with diabetic polyneuropathy: Secondary | ICD-10-CM

## 2011-12-25 DIAGNOSIS — E1049 Type 1 diabetes mellitus with other diabetic neurological complication: Secondary | ICD-10-CM

## 2011-12-25 DIAGNOSIS — E1065 Type 1 diabetes mellitus with hyperglycemia: Secondary | ICD-10-CM

## 2011-12-25 DIAGNOSIS — IMO0002 Reserved for concepts with insufficient information to code with codable children: Secondary | ICD-10-CM

## 2011-12-25 DIAGNOSIS — G909 Disorder of the autonomic nervous system, unspecified: Secondary | ICD-10-CM

## 2011-12-25 DIAGNOSIS — R000 Tachycardia, unspecified: Secondary | ICD-10-CM

## 2011-12-25 DIAGNOSIS — E049 Nontoxic goiter, unspecified: Secondary | ICD-10-CM

## 2011-12-25 LAB — COMPREHENSIVE METABOLIC PANEL
ALT: 14 U/L (ref 0–35)
AST: 16 U/L (ref 0–37)
Alkaline Phosphatase: 78 U/L (ref 39–117)
BUN: 16 mg/dL (ref 6–23)
Calcium: 9.8 mg/dL (ref 8.4–10.5)
Chloride: 102 mEq/L (ref 96–112)
Creat: 0.8 mg/dL (ref 0.50–1.10)
Total Bilirubin: 0.3 mg/dL (ref 0.3–1.2)

## 2011-12-25 LAB — T3, FREE: T3, Free: 3.2 pg/mL (ref 2.3–4.2)

## 2011-12-25 LAB — T4, FREE: Free T4: 1.26 ng/dL (ref 0.80–1.80)

## 2011-12-25 LAB — TSH: TSH: 1.259 u[IU]/mL (ref 0.350–4.500)

## 2011-12-25 NOTE — Progress Notes (Signed)
Subjective:  Patient Name: Victoria Atkinson Date of Birth: 01-17-1991  MRN: [id number]  Victoria Atkinson  presents to the office today for follow-up of her type 1 diabetes mellitus, goiter, hypoglycemia, peripheral neuropathy, headaches, Hashimoto's disease, and hypertension.  HISTORY OF PRESENT ILLNESS:   [name] is a 21 y.o. Caucasian young woman.  [name] was unaccompanied.   1. The patient was first referred to me on 10/31/04 by Dr. Dian Situ from [facility] for evaluation and management of new onset type 1 diabetes mellitus. The patient was 21 years old.   A. The patient had about a 2 month period of polyuria, polydipsia, nocturia, upset stomach, nausea, stomach pains, and a documented 17 pound weight loss. She was often dizzy and lightheaded. At her PCP's office a serum glucose was 357. A serum CO2 was 26. I arranged to see the patient that day. 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 Family history was positive for a cousin with type 1 diabetes mellitus, a maternal grandmother with a "sluggish" thyroid, and a maternal aunt who took thyroid medicine. On physical examination her height was 65-1/2 inches. She weighed 99-1/2 pounds. Her BMI was 16.4. Her CBG was high, which was greater than 500 on our meter. Her hemoglobin A1c was 12.6%.  B. We provided diabetes education to the patient and her family in our office. I started her on Novolog by sliding scale. I subsequently changed the Novolog to our two-component method with a correction dose and food dose at mealtimes. I also added Lantus insulin. 2. During the past seven years, the patient has done well overall. She had a good honeymoon period that lasted about 9 months, during which her hemoglobin A1c's varied from 6.0-6.3%. On 08/23/2005 we started her on insulin pump therapy with a Medtronic Paradigm 722 insulin pump. Since then, her hemoglobin A1c values have varied from 6.5-8.3%.  3. On 10/21/2005,  as part of an evaluation for headaches, an MRI was performed. A 3.1 x 6.2 x 11.0 cm posterior fossa arachnoid cyst was noted. Subsequent evaluation by neurosurgeons at [facility] and at [facility] felt that she had a mega cisterna magna rather than a cyst. This was considered to be normal variant and was not felt to be causing her headaches. She was told that surgery would not be required. 4. The patient's last [facility] visit was on 1212/12. In the interim, she has been healthy. 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 She has tried harder to maintain her hydration and so has not had any lightheaded dizziness. She did develop some left hand numbness for two days in May. 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 She no longer has headaches.  She is no longer having right hand numbness while playing the oboe. She has re-ordered a CGM sensor. 5. Pertinent Review of Systems:  Constitutional: The patient feels "pretty good". She has been healthy.  Eyes: Vision is good with her glasses. Her last exam was in January 2013. She had no signs of diabetic eye disease. She is already scheduled for follow up exam in January 2014. There are no significant eye complaints. Neck: The patient has frequent complaints of anterior neck swelling and soreness.  Heart: Heart rate increases with exercise or other physical activity. The patient has no complaints of palpitations, irregular heat beats, chest pain, or chest pressure. Gastrointestinal: She had more reflux  in association with Sudafed. She resumed Prevacid then. Bowel movents seem normal. The patient has no complaints of excessive hunger, acid reflux, upset stomach, stomach aches or pains, diarrhea, or constipation. Legs: Muscle mass and strength seem normal. There are no complaints of numbness, tingling, burning, or pain. No edema is noted. Feet: Her left great toe went numb last week  after being on her feet for 48 hours. There are no obvious foot problems. There are no other complaints of numbness, tingling, burning, or pain. No edema is noted. GYN: She remains on oral contraceptive pills. MP was 3-4 weeks ago. Hypoglycemia: This occurs occasionally.  6. BG printout: She is having occasional low blood sugars in the 50's to 60's. Most low BGs occur on the day after she has a good site change. Most 300's-400's occur on the second or third day of the site being in place.    PAST MEDICAL, FAMILY, AND SOCIAL HISTORY:  Past Medical History  Diagnosis Date  . Type 1 diabetes mellitus not at goal   . Hypoglycemia associated with diabetes   . Goiter   . DM type 1 with diabetic peripheral neuropathy   . Hashimoto's disease   . Hypertension   . Orthostatic hypotension   . Brain cyst     Family History  Problem Relation Age of Onset  . Thyroid disease Maternal Aunt     Hypothyroid, takes thyroid medicine  . Thyroid disease Maternal Grandmother     Sluggish thyroid  . Cancer Paternal Grandfather   . Diabetes Cousin     T1 DM    Current outpatient prescriptions:cetirizine (ZYRTEC) 10 MG tablet, Take 10 mg by mouth daily., Disp: , Rfl: ;  drospirenone-ethinyl estradiol (YAZ,GIANVI,LORYNA) 3-0.02 MG tablet, Take 1 tablet by mouth daily.  , Disp: , Rfl: ;  lisinopril (PRINIVIL,ZESTRIL) 2.5 MG tablet, Take 1 tablet (2.5 mg total) by mouth daily., Disp: 90 tablet, Rfl: 2 NOVOLOG 100 UNIT/ML injection, USE 300 UNITS IN INSULIN PUMP EVERY 48 TO 72 HOURS AND PER PROTOCOLSFOR HYPOGLYCEMIA PLUS DKA, Disp: 40 mL, Rfl: 3;  DISCONTD: lisinopril (PRINIVIL,ZESTRIL) 2.5 MG tablet, Take 2.5 mg by mouth daily.  , Disp: , Rfl:   Allergies as of 12/25/2011 - Review Complete 12/25/2011  Allergen Reaction Noted  . Zithromax (azithromycin) Hives 08/21/2010    1. 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 2. Activities: She  has not been working out much this Summer, but did a lot of walking at camp.  3. Smoking, alcohol, or drugs: None 4. Primary Care Provider: [facility]  ROS: She has become allergic to raspberries. There are no other significant problems involving AlyssaS other body systems.   Objective:  Vital Signs:  BP 123/77  Pulse 98  Wt 164 lb 11.2 oz (74.707 kg)   Ht Readings from Last 3 Encounters:  03/11/07 5\' 8"  (1.727 m) (93.99%*)   * Growth percentiles are based on CDC 2-20 Years data.   Wt Readings from Last 3 Encounters:  12/25/11 164 lb 11.2 oz (74.707 kg)  04/24/11 160 lb 0.3 oz (72.585 kg)  06/12/07 142 lb 2.1 oz (64.47 kg) (81.19%*)   * Growth percentiles are based on CDC 2-20 Years data.  Normalized stature-for-age data available only for age 62 to 20 years. Normalized weight-for-age data available only for age 62 to 20 years.  PHYSICAL EXAM:  Constitutional: The patient appears healthy and well  nourished. She has obviously gained several pounds.  Face: The face appears normal.  Eyes: There is no obvious arcus or proptosis. Moisture appears normal. Mouth: The oropharynx is normal, but she has several areas of geographic tongue that have re-appeared since she "ran out" of her big MVI.  Oral moisture is normal. Neck: The neck appears to be visibly enlarged. No carotid bruits are noted. The thyroid gland is diffusely enlarged at 25+ grams in size. The consistency of the thyroid gland is normal. The thyroid gland is not tender to palpation. Lungs: The lungs are clear to auscultation. Air movement is good. Heart: Heart rate and rhythm are regular. Heart sounds S1 and S2 are normal. I did not appreciate any pathologic cardiac murmurs. Abdomen: The abdomen is enlarged. Bowel sounds are normal. There is no obvious hepatomegaly, splenomegaly, or other mass effect.  Arms: Muscle size and bulk are normal for age. Hands: There is no obvious tremor. Phalangeal and  metacarpophalangeal joints are normal. Palmar muscles are normal. Palmar skin is normal. Palmar moisture is also normal. Legs: Muscles appear normal for age. No edema is present. Feet: Feet are normally formed. Dorsalis pedal pulses are 1+ on the right and faint 1+ on the left.. Neurologic: Strength is normal for age in both the upper and lower extremities. Muscle tone is normal. Sensation to touch is normal in both the legs and feet.    LAB DATA: Hemoglobin A1c was 7.1%, compared with 5.8% at last visit.           Labs 04/09/11: Cholesterol was 206, triglyceride 182, HDL 72, and LDL 98. TSH was 2.79, free T4 1.16, and free T3 3.8. Her urinary microalbumin/creatinine ratio is 2.1 (normal less than 30).   Assessment and Plan:   ASSESSMENT:  1. T1DM: She is doing fairly well, but still has a lot of variability, mostly attributable to her insertion sites going bad before the next scheduled site change 72 hours later. She needs to change sites as soon as they begin to go bad.  2. Hypoglycemia: While she is not having many low BGs, the ones she does have tend to occur after site changes.  3. Hypertension: Her BP is better today. She needs to take her lisinopril daily. 4. Goiter/thyroiditis/transient hypothyroidism: According to last visit's labs, she had bounced back into the lower portion of  the euthroid range. She's had several episodes of clinical thyroiditis since then. Her thyroid gland is even larger at this visit. We need to repeat TFTS.  5. Peripheral neuropathy: With the improvement in blood glucose control overall, she is not having many signs or symptoms of peripheral neuropathy. 6. Dizziness/orthostatic hypotension: Her symptoms have resolved since she's been trying harder to remain well hydrated.  7. Autonomic neuropathy and tachycardia: Her HR has increased somewhat as her BGs have increased during the past 8 months. These problems are reversible with lower and steadier BG control    PLAN:  1. Diagnostic: Will order TFTS and CMP now. Will order fasting lipid panel and urinary microalbumin/creatinine ratio two weeks prior to next visit.  2. Therapeutic: Please take your lisinopril and vitamins every day.  3. Patient education: We discussed issues of hyperglycemia, hypoglycemia, thyroiditis, and hypertension at length. 4. Follow-up: 3-4 months  Level of Service: This visit lasted in excess of 60 minutes. More than 50% of the visit was devoted to counseling.  [name] Stall, MD 12/25/2011 11:04 AM

## 2011-12-25 NOTE — Patient Instructions (Signed)
Follow up visit in 3-4 months depending upon her college schedule.

## 2011-12-30 ENCOUNTER — Other Ambulatory Visit: Payer: Self-pay | Admitting: *Deleted

## 2012-03-27 ENCOUNTER — Other Ambulatory Visit: Payer: Self-pay | Admitting: "Endocrinology

## 2012-05-07 ENCOUNTER — Other Ambulatory Visit: Payer: Self-pay | Admitting: "Endocrinology

## 2012-06-05 ENCOUNTER — Other Ambulatory Visit: Payer: Self-pay | Admitting: *Deleted

## 2012-06-05 DIAGNOSIS — E1065 Type 1 diabetes mellitus with hyperglycemia: Secondary | ICD-10-CM

## 2012-06-15 ENCOUNTER — Encounter: Payer: Self-pay | Admitting: "Endocrinology

## 2012-06-15 ENCOUNTER — Ambulatory Visit (INDEPENDENT_AMBULATORY_CARE_PROVIDER_SITE_OTHER): Payer: BC Managed Care – PPO | Admitting: "Endocrinology

## 2012-06-15 VITALS — BP 128/83 | HR 108 | Wt 178.4 lb

## 2012-06-15 DIAGNOSIS — R000 Tachycardia, unspecified: Secondary | ICD-10-CM

## 2012-06-15 DIAGNOSIS — E11649 Type 2 diabetes mellitus with hypoglycemia without coma: Secondary | ICD-10-CM

## 2012-06-15 DIAGNOSIS — E063 Autoimmune thyroiditis: Secondary | ICD-10-CM

## 2012-06-15 DIAGNOSIS — E049 Nontoxic goiter, unspecified: Secondary | ICD-10-CM

## 2012-06-15 DIAGNOSIS — G909 Disorder of the autonomic nervous system, unspecified: Secondary | ICD-10-CM

## 2012-06-15 DIAGNOSIS — R197 Diarrhea, unspecified: Secondary | ICD-10-CM

## 2012-06-15 DIAGNOSIS — E1042 Type 1 diabetes mellitus with diabetic polyneuropathy: Secondary | ICD-10-CM

## 2012-06-15 DIAGNOSIS — E1169 Type 2 diabetes mellitus with other specified complication: Secondary | ICD-10-CM

## 2012-06-15 DIAGNOSIS — E1043 Type 1 diabetes mellitus with diabetic autonomic (poly)neuropathy: Secondary | ICD-10-CM

## 2012-06-15 DIAGNOSIS — E1049 Type 1 diabetes mellitus with other diabetic neurological complication: Secondary | ICD-10-CM

## 2012-06-15 DIAGNOSIS — E1065 Type 1 diabetes mellitus with hyperglycemia: Secondary | ICD-10-CM

## 2012-06-15 DIAGNOSIS — I10 Essential (primary) hypertension: Secondary | ICD-10-CM

## 2012-06-15 NOTE — Patient Instructions (Signed)
Follow up visit in 3-4 months.  

## 2012-06-15 NOTE — Progress Notes (Signed)
Subjective:  Patient Name: Victoria Atkinson Date of Birth: December 16, 1990  MRN: [id number]  Victoria Atkinson  presents to the office today for follow-up of her type 1 diabetes mellitus, goiter, hypoglycemia, peripheral neuropathy, headaches, Hashimoto's disease, and hypertension.  HISTORY OF PRESENT ILLNESS:   Victoria Atkinson is a 22 y.o. Caucasian young woman.  Victoria Atkinson was unaccompanied.   1. The patient was first referred to me on 10/31/04 by Dr. Crista Luria from [facility] for evaluation and management of new onset type 1 diabetes mellitus. The patient was 22 years old.   A. The patient had about a 2 month period of polyuria, polydipsia, nocturia, upset stomach, nausea, stomach pains, and a documented 17 pound weight loss. She was often dizzy and lightheaded. At her PCP's office a serum glucose was 357. A serum CO2 was 26. I arranged to see the patient that day. 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 Family history was positive for a cousin with type 1 diabetes mellitus, a maternal grandmother with a "sluggish" thyroid, and a maternal aunt who took thyroid medicine. On physical examination her height was 65-1/2 inches. She weighed 99-1/2 pounds. Her BMI was 16.4. Her CBG was high, which was greater than 500 on our meter. Her hemoglobin A1c was 12.6%.  B. We provided diabetes education to the patient and her family in our office. I started her on Novolog by sliding scale. I subsequently changed the Novolog to our two-component method with a correction dose and food dose at mealtimes. I also added Lantus insulin. 2. During the past seven years, the patient has done well overall. She had a good honeymoon period that lasted about 9 months, during which her hemoglobin A1c's varied from 6.0-6.3%. On 08/23/2005 we started her on insulin pump therapy with a Medtronic Paradigm 722 insulin pump. Since then, her hemoglobin A1c values have varied from 6.5-8.3%.  3. On 10/21/2005,  as part of an evaluation for headaches, an MRI was performed. A 3.1 x 6.2 x 11.0 cm posterior fossa arachnoid cyst was noted. Subsequent evaluation by [facility]surgeons at [facility] and at [facility] felt that she had a mega cisterna magna rather than a cyst. This was considered to be normal variant and was not felt to be causing her headaches. She was told that surgery would not be required. 4. The patient's last [facility] visit was on 12/25/11. In the interim, she has been having bad bouts of diarrhea, almost daily, for the past three months. She had attributed the diarrhea to stress, but although the diarrhea was less frequent when she was home for Xmas break, it did not totally resolve. She also has gas and abdominal pains. She has not sen any blood in the stool. She has had some lightheaded dizziness. She has a CGM sensor, but has had problems with insertion and has not used it often.. 5. Pertinent Review of Systems:  Constitutional: The patient feels "pretty good". She has been healthy.  Eyes: Vision is good with her glasses. Her last exam was in January 2013. She had no signs of diabetic eye disease. She is trying to schedule a follow up exam. There are no significant eye complaints. Neck: The patient has frequent complaints of anterior neck swelling and soreness.  Heart: Heart rate increases with exercise or other physical activity. The patient has no complaints of palpitations, irregular heat beats, chest pain, or chest pressure. Gastrointestinal: As above.The patient has no complaints of excessive  hunger, acid reflux, upset stomach, stomach aches or pains, diarrhea, or constipation. Legs: Muscle mass and strength seem normal. There are no complaints of numbness, tingling, burning, or pain. No edema is noted. Feet: There are no obvious foot problems. There are no complaints of numbness, tingling, burning, or pain. No edema is noted. GYN: She remains on oral contraceptive  pills. LMP was 2 weeks ago. Hypoglycemia: None.  6. BG printout: BGs are quite variable, but generally higher. All of her high 300s and > 400s are associated with poor sites. She checks BG and boluses 3-5 times per day. Average BG is 262.     PAST MEDICAL, FAMILY, AND SOCIAL HISTORY:  Past Medical History  Diagnosis Date  . Type 1 diabetes mellitus not at goal   . Hypoglycemia associated with diabetes   . Goiter   . DM type 1 with diabetic peripheral neuropathy   . Hashimoto's disease   . Hypertension   . Orthostatic hypotension   . Brain cyst     Family History  Problem Relation Age of Onset  . Thyroid disease Maternal Aunt     Hypothyroid, takes thyroid medicine  . Thyroid disease Maternal Grandmother     Sluggish thyroid  . Cancer Paternal Grandfather   . Diabetes Cousin     T1 DM    Current outpatient prescriptions:cetirizine (ZYRTEC) 10 MG tablet, Take 10 mg by mouth daily., Disp: , Rfl: ;  drospirenone-ethinyl estradiol (YAZ,GIANVI,LORYNA) 3-0.02 MG tablet, Take 1 tablet by mouth daily.  , Disp: , Rfl: ;  lisinopril (PRINIVIL,ZESTRIL) 2.5 MG tablet, TAKE ONE TABLET BY MOUTH ONE TIME DAILY, Disp: 90 tablet, Rfl: 1 NOVOLOG 100 UNIT/ML injection, USE 300 UNITS IN INSULIN PUMP EVERY 48 TO 72 HOURS AND PER PROTOCOLSFOR HYPOGLYCEMIA PLUS DKA, Disp: 4 vial, Rfl: 5  Allergies as of 06/15/2012 - Review Complete 06/15/2012  Allergen Reaction Noted  . Zithromax (azithromycin) Hives 08/21/2010    1. 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 She will take a year off to do internships, then apply to med school. 2. Activities: She has been working out more.   3. Smoking, alcohol, or drugs: None 4. Primary Care Provider: [facility]  REVIEW OF SYSTEMS: She is allergic to raspberries. There are no other significant problems involving Victoria Atkinson's other body systems.   Objective:  Vital Signs:  BP 128/83  Pulse 108  Wt 178  lb 6.4 oz (80.922 kg)   Ht Readings from Last 3 Encounters:  03/11/07 5\' 8"  (1.727 m) (93.99%*)   * Growth percentiles are based on CDC 2-20 Years data.   Wt Readings from Last 3 Encounters:  06/15/12 178 lb 6.4 oz (80.922 kg)  12/25/11 164 lb 11.2 oz (74.707 kg)  04/24/11 160 lb 0.3 oz (72.585 kg)  Normalized stature-for-age data available only for age 52 to 20 years. Normalized weight-for-age data available only for age 52 to 20 years.  PHYSICAL EXAM:  Constitutional: The patient appears healthy and well nourished. She has obviously gained several pounds, 14 to be exact..  Face: The face appears normal.  Eyes: There is no obvious arcus or proptosis. Moisture appears normal. Mouth: The oropharynx and tongue are normal.  Oral moisture is normal. Neck: The neck appears to be visibly enlarged. No carotid bruits are noted. The thyroid gland is diffusely enlarged at 20-25 grams in size. The consistency of the thyroid gland is normal. The thyroid gland is not tender  to palpation. Lungs: The lungs are clear to auscultation. Air movement is good. Heart: Heart rate and rhythm are regular. Heart sounds S1 and S2 are normal. I did not appreciate any pathologic cardiac murmurs. Abdomen: The abdomen is enlarged. Bowel sounds are normal. There is no obvious hepatomegaly, splenomegaly, or other mass effect.  Arms: Muscle size and bulk are normal for age. Hands: There is no obvious tremor. Phalangeal and metacarpophalangeal joints are normal. Palmar muscles are normal. Palmar skin is normal. Palmar moisture is also normal. Legs: Muscles appear normal for age. No edema is present. Feet: Feet are normally formed. Dorsalis pedal pulses are 1+ on the right and faint 1+ on the left.. Neurologic: Strength is normal for age in both the upper and lower extremities. Muscle tone is normal. Sensation to touch is normal in both the legs and feet.    LAB DATA: Hemoglobin A1c was 8.0%, compared with 7.1% at last  visit and with 5.8% at the prior visit when she was having more low BGs.   Labs: 12/25/11: TSH 1.259, free T4 1.26, free T3 3.2; CMP normal          Assessment and Plan:   ASSESSMENT:  1. T1DM: BGs are higher, partly due to her exercising less, but partly due to having taken in a lot more calories than she burned off, resulting in a gain of fat weight. The fat cells use the extra raw material to make cytokines which cause increased resistance to insulin. She is doing fairly well, but still has a lot of BG variability, mostly attributable to her insertion sites going bad before the next scheduled site change 72 hours later. She needs to change sites as soon as they begin to go bad.  2. Hypoglycemia: She is not having many low BGs.  3. Hypertension: Her BP is worse today. She needs daily exercise and daily lisinopril. 4. Goiter/thyroiditis/transient hypothyroidism: According to last visit's labs, her TFTs were mid-range euthyroid. Her thyroid gland is smaller today. The waxing and waning of thyroid gland size is c/w evolving Hashimoto's disease. We need to repeat TFTS.  5. Peripheral neuropathy: With the improvement in blood glucose control overall two visits ago, she is not having many signs or symptoms of peripheral neuropathy. She is still benefiting from that improvement in BG. If her BGs do not improve, however, the neuropathy will recur. 6. Dizziness/orthostatic hypotension: Her symptoms have resolved since she's been trying harder to remain well hydrated.  7. Autonomic neuropathy and tachycardia: Her HR has increased somewhat as her BGs have increased during the past 8 months. These problems are reversible with lower and steadier BG control. 8. Thyroiditis: Clinically quiescent.  9. Diarrhea: Given the duration of the diarrhea, it makes sense to screen her for celiac disease.  PLAN:  1. Diagnostic: Will order TTG IgA and Ig A. Will order CMP, fasting lipid panel, and urinary  microalbumin/creatinine ratio two weeks prior to next visit.  2. Therapeutic: Please take your lisinopril and vitamins every day. New basal rates:  Midnight: 0.450 -> 0.475 4 Am: 0.900 -> 0.950 9 AM: 0.350 -> 0.375 1 PM: 0.450 6 PM: 0.500 -> 0.550 10 PM: 0.300 -> 0.300> 0.325 3. Patient education: We discussed issues of hyperglycemia, hypoglycemia, thyroiditis, weight gain, and hypertension at length. 4. Follow-up: 3-4 months  Level of Service: This visit lasted in excess of 50 minutes. More than 50% of the visit was devoted to counseling.  [name] Stall, MD 06/15/2012 2:50 PM

## 2012-06-16 DIAGNOSIS — E1043 Type 1 diabetes mellitus with diabetic autonomic (poly)neuropathy: Secondary | ICD-10-CM | POA: Insufficient documentation

## 2012-06-16 DIAGNOSIS — E1042 Type 1 diabetes mellitus with diabetic polyneuropathy: Secondary | ICD-10-CM | POA: Insufficient documentation

## 2012-06-22 ENCOUNTER — Other Ambulatory Visit: Payer: Self-pay | Admitting: "Endocrinology

## 2012-06-22 ENCOUNTER — Telehealth: Payer: Self-pay | Admitting: *Deleted

## 2012-06-22 LAB — LIPID PANEL
Cholesterol: 236 mg/dL — ABNORMAL HIGH (ref 0–200)
HDL: 68 mg/dL
LDL Cholesterol: 108 mg/dL — ABNORMAL HIGH (ref 0–99)
Total CHOL/HDL Ratio: 3.5 ratio
Triglycerides: 300 mg/dL — ABNORMAL HIGH
VLDL: 60 mg/dL — ABNORMAL HIGH (ref 0–40)

## 2012-06-22 NOTE — Telephone Encounter (Signed)
I received a message that Vauda wants [employer].  She is away at college and has a May appt with Dr. Fransico Michael.  Left a voice mail for Mom to call [employer]: 1. If their CGMS Transmitter is working okay and they just need the [employer] with the inserter, call [facility] [employer] [facility]. 2. If they need a new transmitter, call the [employer].  Felisa Bonier will send [place] the orders to sign and we fax it back to them.  If further problems, call us.

## 2012-06-23 LAB — IGA: IgA: 152 mg/dL (ref 69–380)

## 2012-07-17 ENCOUNTER — Telehealth: Payer: Self-pay | Admitting: "Endocrinology

## 2012-07-17 NOTE — Telephone Encounter (Signed)
1. Mom called our office to inquire about Victoria Atkinson's recent lab results. Because the lab results had not come to my in-box, I had to go to Victoria Atkinson's chart and find them. I called mom this morning.   2. Cholesterol increased to 236 from last year, triglycerides increased to 300, HDL decreased to 68, and LDL increased to 108; IgA 152 and TTG IgA 6.4, both normal; microalbumin/creatinine ratio 3.1, normal. Mom, who is a Systems analyst, and dad, who is still very athletic, are worried about Victoria Atkinson's weight gain, decrease in physical activities, and increase in HbA1c. I explained that if Victoria Atkinson is more careful with her diet, increases exercise, and gets better control of BGs, her lipids will improve. She may, however, need statin therapy. Mom will pass this info along to Victoria Atkinson. 3. Mom mentioned that she is surprised that the celiac disease tests are normal. Victoria Atkinson has been on a gluten-free diet and feels better. I explained to mom that there are often large incongruities between T cell and B cell activities. Some people may also be sensitive to wheat fiber, without actually having celiac disease. One advantage of a gluten-free diet, even for people who do not have celiac disease, is that they reduce their caloric intake and carb intake, thereby reducing their BGs and their weights.  [name] Stall

## 2012-09-16 ENCOUNTER — Encounter: Payer: Self-pay | Admitting: "Endocrinology

## 2012-09-16 ENCOUNTER — Other Ambulatory Visit: Payer: Self-pay | Admitting: "Endocrinology

## 2012-09-16 ENCOUNTER — Ambulatory Visit (INDEPENDENT_AMBULATORY_CARE_PROVIDER_SITE_OTHER): Payer: PRIVATE HEALTH INSURANCE | Admitting: "Endocrinology

## 2012-09-16 VITALS — BP 124/78 | HR 116 | Ht 67.09 in | Wt 170.9 lb

## 2012-09-16 DIAGNOSIS — E063 Autoimmune thyroiditis: Secondary | ICD-10-CM

## 2012-09-16 DIAGNOSIS — G909 Disorder of the autonomic nervous system, unspecified: Secondary | ICD-10-CM

## 2012-09-16 DIAGNOSIS — E1042 Type 1 diabetes mellitus with diabetic polyneuropathy: Secondary | ICD-10-CM

## 2012-09-16 DIAGNOSIS — E1065 Type 1 diabetes mellitus with hyperglycemia: Secondary | ICD-10-CM

## 2012-09-16 DIAGNOSIS — R000 Tachycardia, unspecified: Secondary | ICD-10-CM

## 2012-09-16 DIAGNOSIS — E11649 Type 2 diabetes mellitus with hypoglycemia without coma: Secondary | ICD-10-CM

## 2012-09-16 DIAGNOSIS — E1043 Type 1 diabetes mellitus with diabetic autonomic (poly)neuropathy: Secondary | ICD-10-CM

## 2012-09-16 DIAGNOSIS — E1169 Type 2 diabetes mellitus with other specified complication: Secondary | ICD-10-CM

## 2012-09-16 DIAGNOSIS — IMO0002 Reserved for concepts with insufficient information to code with codable children: Secondary | ICD-10-CM

## 2012-09-16 DIAGNOSIS — R1011 Right upper quadrant pain: Secondary | ICD-10-CM

## 2012-09-16 DIAGNOSIS — E1049 Type 1 diabetes mellitus with other diabetic neurological complication: Secondary | ICD-10-CM

## 2012-09-16 DIAGNOSIS — E049 Nontoxic goiter, unspecified: Secondary | ICD-10-CM

## 2012-09-16 LAB — CBC WITH DIFFERENTIAL/PLATELET
Basophils Absolute: 0.1 10*3/uL (ref 0.0–0.1)
Eosinophils Relative: 2 % (ref 0–5)
HCT: 38.9 % (ref 36.0–46.0)
Hemoglobin: 13.3 g/dL (ref 12.0–15.0)
Lymphocytes Relative: 39 % (ref 12–46)
Lymphs Abs: 2.8 10*3/uL (ref 0.7–4.0)
MCV: 86.6 fL (ref 78.0–100.0)
Monocytes Absolute: 0.6 10*3/uL (ref 0.1–1.0)
Monocytes Relative: 8 % (ref 3–12)
Neutro Abs: 3.7 10*3/uL (ref 1.7–7.7)
RBC: 4.49 MIL/uL (ref 3.87–5.11)
RDW: 12.5 % (ref 11.5–15.5)
WBC: 7.3 10*3/uL (ref 4.0–10.5)

## 2012-09-16 LAB — COMPREHENSIVE METABOLIC PANEL
ALT: 13 U/L (ref 0–35)
AST: 13 U/L (ref 0–37)
Calcium: 9.6 mg/dL (ref 8.4–10.5)
Chloride: 103 mEq/L (ref 96–112)
Creat: 0.84 mg/dL (ref 0.50–1.10)
Sodium: 138 mEq/L (ref 135–145)
Total Protein: 7 g/dL (ref 6.0–8.3)

## 2012-09-16 LAB — POCT GLYCOSYLATED HEMOGLOBIN (HGB A1C): Hemoglobin A1C: 7.7

## 2012-09-16 MED ORDER — LISINOPRIL 5 MG PO TABS: 5.0000 mg | ORAL_TABLET | Freq: Every day | ORAL | Status: DC

## 2012-09-16 NOTE — Progress Notes (Signed)
Subjective:  Patient Name: Victoria Atkinson Date of Birth: [birth year]  MRN: [id number]  Victoria Atkinson  presents to the office today for follow-up of her type 1 diabetes mellitus, goiter, hypoglycemia, peripheral neuropathy, headaches, Hashimoto's disease, and hypertension.  HISTORY OF PRESENT ILLNESS:   Victoria Atkinson is a 22 y.o. Caucasian young woman.  Victoria Atkinson was unaccompanied.   1. The patient was first referred to me on 10/31/04 by Dr. Crista Luria from [facility] for evaluation and management of new onset type 1 diabetes mellitus. The patient was 22 years old.   A. The patient had about a 2 month period of polyuria, polydipsia, nocturia, upset stomach, nausea, stomach pains, and a documented 17 pound weight loss. She was often dizzy and lightheaded. At her PCP's office a serum glucose was 357. A serum CO2 was 26. I arranged to see the patient that day. 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 Family history was positive for a cousin with type 1 diabetes mellitus, a maternal grandmother with a "sluggish" thyroid, and a maternal aunt who took thyroid medicine. On physical examination her height was 65-1/2 inches. She weighed 99-1/2 pounds. Her BMI was 16.4. Her CBG was high, which was greater than 500 on our meter. Her hemoglobin A1c was 12.6%.  B. We provided diabetes education to the patient and her family in our office. I started her on Novolog by sliding scale. I subsequently changed the Novolog to our two-component method with a correction dose and food dose at mealtimes. I also added Lantus insulin.  2. During the past eight years, the patient has done well overall. She had a good honeymoon period that lasted about 9 months, during which her hemoglobin A1c's varied from 6.0-6.3%. On 08/23/2005 we started her on insulin pump therapy with a Medtronic Paradigm 722 insulin pump. Since then, her hemoglobin A1c values have varied from 6.5-8.3%.   3. On  10/21/2005, as part of an evaluation for headaches, an MRI was performed. A 3.1 x 6.2 x 11.0 cm posterior fossa arachnoid cyst was noted. Subsequent evaluation by neurosurgeons at [facility] and at [facility] felt that she had a mega cisterna magna rather than a cyst. This was considered to be normal variant and was not felt to be causing her headaches. She was told that surgery would not be required.  4. The patient's last [facility] visit was on 06/15/12. In the interim, she had been feeling much better. About one month ago she had a single episode of bilious vomiting. She woke up two mornings in the past two weeks with severe RUQ pain radiating around to her right back. During the first episode the pain also radiated upward to her right shoulder. During the second episode on 09/09/12 she also had a temperature of 101.1. During each episode she took  and slept the pain away. However, even a few days later if she turned her body just the wrong way she would have shooting pains in her RUQ and right back. Her bouts of diarrhea, gas, and abdominal pains all resolved with a gluten-free diet.  She has had frequent left frontal headaches, but no more lightheaded dizziness. She has a CGM sensor, but has had problems with insertion and has not used it often. Since going gluten-free she has had more problems counting carbs.  5. Pertinent Review of Systems:  Constitutional: The patient feels "pretty good". She has been healthy.  Eyes: Vision is good with  her glasses. Her last exam was last month in April. She had no signs of diabetic eye disease. There are no significant eye complaints. Neck: The patient has frequent complaints of anterior neck swelling and soreness.  Heart: Heart rate increases with exercise or other physical activity. The patient has no complaints of palpitations, irregular heat beats, chest pain, or chest pressure. Gastrointestinal: As above.The patient has no complaints of  excessive hunger, acid reflux, upset stomach, stomach aches or other pains, diarrhea, or constipation. Legs: Muscle mass and strength seem normal. There are no complaints of numbness, tingling, burning, or pain. No edema is noted. Feet: There are no obvious foot problems. There are no complaints of numbness, tingling, burning, or pain. No edema is noted. GYN: She remains on oral contraceptive pills. LMP was 3 weeks ago. Hypoglycemia: Not many episodes  6. BG printout: BGs are quite variable, but generally fairly high. Most of her high 300s and > 400s are associated with poor sites, but BGs rarely drop below 200 even with the better sites. She tends to stay up late and sleep in late, so her 2 AM and noon BG checks are often higher than the BGs during the remainder of the day. Her largest spread of BGs is at the 2-3 AM time period and in the evening. She checks BGs and boluses 2-5 times per day. Average BG is 233, compared with 262 at last visit.Marland Kitchen     PAST MEDICAL, FAMILY, AND SOCIAL HISTORY:  Past Medical History  Diagnosis Date  . Type 1 diabetes mellitus not at goal   . Hypoglycemia associated with diabetes   . Goiter   . DM type 1 with diabetic peripheral neuropathy   . Hashimoto's disease   . Hypertension   . Orthostatic hypotension   . Brain cyst     Family History  Problem Relation Age of Onset  . Thyroid disease Maternal Aunt     Hypothyroid, takes thyroid medicine  . Thyroid disease Maternal Grandmother     Sluggish thyroid  . Cancer Paternal Grandfather   . Diabetes Cousin     T1 DM    Current outpatient prescriptions:cetirizine (ZYRTEC) 10 MG tablet, Take 10 mg by mouth daily., Disp: , Rfl: ;  cetirizine-pseudoephedrine (ZYRTEC-D) 5-120 MG per tablet, Take 1 tablet by mouth 2 (two) times daily., Disp: , Rfl: ;  drospirenone-ethinyl estradiol (YAZ,GIANVI,LORYNA) 3-0.02 MG tablet, Take 1 tablet by mouth daily.  , Disp: , Rfl:  lisinopril (PRINIVIL,ZESTRIL) 2.5 MG tablet, TAKE  ONE TABLET BY MOUTH ONE TIME DAILY, Disp: 90 tablet, Rfl: 1;  NOVOLOG 100 UNIT/ML injection, USE 300 UNITS IN INSULIN PUMP EVERY 48 TO 72 HOURS AND PER PROTOCOLSFOR HYPOGLYCEMIA PLUS DKA, Disp: 4 vial, Rfl: 5  Allergies as of 09/16/2012 - Review Complete 09/16/2012  Allergen Reaction Noted  . Zithromax (azithromycin) Hives 08/21/2010    1. Work and Family: She graduated from [employer] last Saturday. 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 She will work at [employer] from August to January. Her maternal grandmother was diagnosed with thyroid cancer last autumn. She had been hypothyroid for many years previously.    2. 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    3. Smoking, alcohol, or drugs: None 4. Primary Care Provider: PA Elpidio Anis at [facility] at [facility]  REVIEW OF  SYSTEMS: She is allergic to raspberries. There are no other significant problems involving Victoria Atkinson's other body systems.   Objective:  Vital Signs:  BP 124/78  Pulse 116  Ht 5' 7.09" (1.704 m)  Wt 170 lb 14.4 oz (77.52 kg)  BMI 26.7 kg/m2   Ht Readings from Last 3 Encounters:  09/16/12 5' 7.09" (1.704 m)  03/11/07 5\' 8"  (1.727 m) (94%*, Z = 1.55)   * Growth percentiles are based on CDC 2-20 Years data.   Wt Readings from Last 3 Encounters:  09/16/12 170 lb 14.4 oz (77.52 kg)  06/15/12 178 lb 6.4 oz (80.922 kg)  12/25/11 164 lb 11.2 oz (74.707 kg)  Normalized stature-for-age data available only for age 93 to 20 years. Normalized weight-for-age data available only for age 93 to 20 years.  PHYSICAL EXAM:  Constitutional: The patient appears healthy and well nourished. She has obviously lost 8 pounds since her last visit. She still appears overweight.  Face: The face appears normal.  Eyes: There is no obvious arcus or proptosis. Moisture appears  normal. Mouth: The oropharynx and tongue are normal.  Oral moisture is normal. Neck: The neck appears to be visibly enlarged. No carotid bruits are noted. The thyroid gland is diffusely enlarged at 20+ grams in size. The consistency of the thyroid gland is normal. The thyroid gland is not tender to palpation. Lungs: The lungs are clear to auscultation. Air movement is good. Heart: Heart rate and rhythm are regular. Heart sounds S1 and S2 are normal. I did not appreciate any pathologic cardiac murmurs. Abdomen: The abdomen is enlarged. Bowel sounds are normal. There is no obvious hepatomegaly, splenomegaly, or other mass effect.  Arms: Muscle size and bulk are normal for age. Hands: There is no obvious tremor. Phalangeal and metacarpophalangeal joints are normal. Palmar muscles are normal. Palmar skin is normal. Palmar moisture is also normal. Legs: Muscles appear normal for age. No edema is present. Feet: Feet are normally formed. Dorsalis pedal pulses are 1+ on the right and faint 1+ on the left. Neurologic: Strength is normal for age in both the upper and lower extremities. Muscle tone is normal. Sensation to touch is normal in both the legs and feet.    LAB DATA: Hemoglobin A1c was 7.7%, compared with 8.0% at last visit and with 7.1% at the prior visit.  Labs: 06/22/12: Cholesterol 236, triglycerides 300, HDL 68, LDL, 108 (pre-gluten-free diet); TFT IgA 6.4 (<20); IgA 152 (69-380); urinary microalbumin/creatinine ratio 3.1 Labs: 12/25/11: TSH 1.259, free T4 1.26, free T3 3.2; CMP normal          Assessment and Plan:   ASSESSMENT:  1. T1DM: BGs are lower since starting her gluten-free diet. She has also lost weight, which makes her less insulin-resistant and more insulin-sensitive. She is doing fairly well, but still has a lot of BG variability, mostly attributable to her insertion sites going bad before the next scheduled site change 72 hours later or difficulty in calculating carb counts with  her gluten-free diet. She needs to change sites as soon as they begin to go bad.  2. Hypoglycemia: She is not having many low BGs.  3. Hypertension: Her BP is slightly better today. She needs daily exercise. We also need to increase her lisinopril to 5 mg/day.  4. Goiter/thyroiditis/transient hypothyroidism: According to last visit's labs, her TFTs were mid-range euthyroid. Her thyroid gland is smaller today. The waxing and waning of thyroid gland size is c/w evolving Hashimoto's disease. We need to repeat  her TFTs in late July. 5. Peripheral neuropathy: With the improvement in blood glucose control overall, she is not having any signs or symptoms of peripheral neuropathy.  6. Dizziness/orthostatic hypotension: Her symptoms have resolved since she's been trying harder to remain well hydrated.  7. Autonomic neuropathy and tachycardia: Her HR has increased somewhat paralleling, but lagging behind her increase in HbA1c at last visit. These problems are reversible with lower and steadier BG control. 8. Thyroiditis: Clinically quiescent.  9. Diarrhea: This resolved after starting the gluten-free diet, even though the screening test for celiac disease was negative.  10. Episode of bilious vomiting: I don't know what to make of a single episode. 11. Two episodes of RUQ pain, radiating around to the back, one with fever: Although she is not at the age that I would expect to have gall bladder disease, things happen. Since she will be out of the area for six months, I'd ike to start the evaluation process now.  Her PA and supervising physician can continue the evaluation as they wish.   PLAN:  1. Diagnostic: Will order CMP, CBC, and sed rate today. Will order gall bladder ultrasound today. Fasting lipid panel and TFTs in mid-July.   2. Therapeutic: Increase lisinopril to 5 mg/day.  New basal rates:  Midnight:  0.475 -> 0.500 4 AM: 0.950 -> 1.00 9 AM: 0.375 -> 0.400 1 PM: 0.450 -> 0.475 6 PM: 0.550 ->  0.575 10 PM:0.325 -> 0.375 3. Patient education: We discussed issues of hyperglycemia, hypoglycemia, thyroiditis, weight gain, and hypertension at length. 4. Follow-up: late July  Level of Service: This visit lasted in excess of 75 minutes. More than 50% of the visit was devoted to counseling.  [name] Stall, MD 09/16/2012 4:30 PM

## 2012-09-16 NOTE — Patient Instructions (Signed)
Follow up visit in late July.

## 2012-09-17 LAB — SEDIMENTATION RATE: Sed Rate: 12 mm/hr (ref 0–22)

## 2012-09-22 ENCOUNTER — Ambulatory Visit
Admission: RE | Admit: 2012-09-22 | Discharge: 2012-09-22 | Disposition: A | Discharge status: Home / Self Care | Payer: PRIVATE HEALTH INSURANCE | Source: Ambulatory Visit | Attending: "Endocrinology | Admitting: "Endocrinology

## 2012-09-22 ENCOUNTER — Ambulatory Visit: Payer: BC Managed Care – PPO | Admitting: "Endocrinology

## 2012-09-22 DIAGNOSIS — R1011 Right upper quadrant pain: Secondary | ICD-10-CM

## 2012-10-30 ENCOUNTER — Other Ambulatory Visit: Payer: Self-pay | Admitting: *Deleted

## 2012-10-30 DIAGNOSIS — E1065 Type 1 diabetes mellitus with hyperglycemia: Secondary | ICD-10-CM

## 2012-11-16 ENCOUNTER — Ambulatory Visit (INDEPENDENT_AMBULATORY_CARE_PROVIDER_SITE_OTHER): Payer: PRIVATE HEALTH INSURANCE | Admitting: "Endocrinology

## 2012-11-16 ENCOUNTER — Encounter: Payer: Self-pay | Admitting: "Endocrinology

## 2012-11-16 VITALS — BP 118/72 | HR 102 | Wt 163.0 lb

## 2012-11-16 DIAGNOSIS — E1169 Type 2 diabetes mellitus with other specified complication: Secondary | ICD-10-CM

## 2012-11-16 DIAGNOSIS — I951 Orthostatic hypotension: Secondary | ICD-10-CM

## 2012-11-16 DIAGNOSIS — I10 Essential (primary) hypertension: Secondary | ICD-10-CM

## 2012-11-16 DIAGNOSIS — E063 Autoimmune thyroiditis: Secondary | ICD-10-CM

## 2012-11-16 DIAGNOSIS — G909 Disorder of the autonomic nervous system, unspecified: Secondary | ICD-10-CM

## 2012-11-16 DIAGNOSIS — I498 Other specified cardiac arrhythmias: Secondary | ICD-10-CM

## 2012-11-16 DIAGNOSIS — E1049 Type 1 diabetes mellitus with other diabetic neurological complication: Secondary | ICD-10-CM

## 2012-11-16 DIAGNOSIS — E1042 Type 1 diabetes mellitus with diabetic polyneuropathy: Secondary | ICD-10-CM

## 2012-11-16 DIAGNOSIS — E1065 Type 1 diabetes mellitus with hyperglycemia: Secondary | ICD-10-CM

## 2012-11-16 DIAGNOSIS — E1043 Type 1 diabetes mellitus with diabetic autonomic (poly)neuropathy: Secondary | ICD-10-CM

## 2012-11-16 DIAGNOSIS — E049 Nontoxic goiter, unspecified: Secondary | ICD-10-CM

## 2012-11-16 DIAGNOSIS — R000 Tachycardia, unspecified: Secondary | ICD-10-CM

## 2012-11-16 DIAGNOSIS — E11649 Type 2 diabetes mellitus with hypoglycemia without coma: Secondary | ICD-10-CM

## 2012-11-16 LAB — GLUCOSE, POCT (MANUAL RESULT ENTRY): POC Glucose: 111 mg/dl — AB (ref 70–99)

## 2012-11-16 NOTE — Patient Instructions (Signed)
Follow up visit in about 6-7 months. Try the temporary basal rate function more often.

## 2012-11-16 NOTE — Progress Notes (Signed)
Subjective:  Patient Name: Victoria Atkinson Date of Birth: 09/11/1990  MRN: [id number]  Fina Heizer  presents to the office today for follow-up of her type 1 diabetes mellitus, goiter, hypoglycemia, peripheral neuropathy, headaches, Hashimoto's disease, and hypertension.  HISTORY OF PRESENT ILLNESS:   Nathan is a 22 y.o. Caucasian young woman.  Rahi was accompanied by her mother.   1. The patient was first referred to me on 10/31/04 by Dr. Crista Luria from [facility] for evaluation and management of new onset type 1 diabetes mellitus. The patient was 22 years old.   A. The patient had about a 2 month period of polyuria, polydipsia, nocturia, upset stomach, nausea, stomach pains, and a documented 17 pound weight loss. She was often dizzy and lightheaded. At her PCP's office a serum glucose was 357. A serum CO2 was 26. I arranged to see the patient that day. 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 Family history was positive for a cousin with type 1 diabetes mellitus, a maternal grandmother with a "sluggish" thyroid, and a maternal aunt who took thyroid medicine. On physical examination her height was 65-1/2 inches. She weighed 99-1/2 pounds. Her BMI was 16.4. Her CBG was high, which was greater than 500 on our meter. Her hemoglobin A1c was 12.6%.  B. We provided diabetes education to the patient and her family in our office. I started her on Novolog by sliding scale. I subsequently changed the Novolog to our two-component method with a correction dose and food dose at mealtimes. I also added Lantus insulin.  2. During the past eight years, the patient has done well overall. She had a good honeymoon period that lasted about 9 months, during which her hemoglobin A1c's varied from 6.0-6.3%. On 08/23/2005 we started her on insulin pump therapy with a Medtronic Paradigm 722 insulin pump. Since then, her hemoglobin A1c values have varied from 6.5-8.3%.   3.  On 10/21/2005, as part of an evaluation for headaches, an MRI was performed. A 3.1 x 6.2 x 11.0 cm posterior fossa arachnoid cyst was noted. Subsequent evaluation by neurosurgeons at [facility] and at [facility] felt that she had a mega cisterna magna rather than a cyst. This was considered to be normal variant and was not felt to be causing her headaches. She was told that surgery would not be required.  4. The patient's last [facility] visit was on 09/16/12. In the interim, she has been feeling much better.   A. She has not had any additional episodes of bilious vomiting, fever, and RUQ pains. She is still following the gluten-free diet. Since going gluten-free she has had more problems counting carbs. She has not had frequent left frontal headaches if she remains hydrated.   B. She has a CGM sensor, but has had problems with insertion and has not used it often.   C. Camp food causes more fluctuations in her BGs, as well as constipation.  D. About two weeks ago she became lightheaded while working outside in the heat at camp. Her BG was 107. Rest and drinking fluids relieved her symptoms.   5. Pertinent Review of Systems:  Constitutional: The patient feels "pretty good". She has been healthy.  Eyes: Vision is good with her glasses. Her last exam was last month in April. She had no signs of diabetic eye disease. There are no significant eye complaints. Mouth: She frequently misses her vitamins, so her geographic tongue has not  healed.  Neck: The patient has frequent complaints of anterior neck swelling and soreness. She thinks that her goiter has decreased in size. Heart: Heart rate increases with exercise or other physical activity. The patient has no complaints of palpitations, irregular heat beats, chest pain, or chest pressure. Gastrointestinal: As above.She still has reflux at times, especially if she has acidotic foods. The patient has no complaints of excessive hunger,  acid reflux, upset stomach, stomach aches or other pains, diarrhea, or constipation. Legs: Muscle mass and strength seem normal. There are no complaints of numbness, tingling, burning, or pain. No edema is noted. Feet: There are no obvious foot problems. There are no complaints of numbness, tingling, burning, or pain. No edema is noted. GYN: She remains on oral contraceptive pills. LMP was 3 weeks ago. Hypoglycemia: She was having up to tow episodes of hypoglycemia per day at the start of the Summer. She has since reduced her boluses and the BGs are better.   6. BG printout: BGs are quite variable. She has been having many more low BGs during the mornings, afternoons, and evenings when she had been physically active in the heat. Most of her 300s are associated with poor sites. When her sites are working, her BGs are much letter, sometimes too low. She often forgets to check BG late at night and sometimes in the mornings as well. She occasionally intentionally does not take a bolus if she feels that she will be excessively active. Her largest spread of BGs is later in the evening. She checks BGs 2-5 times per day and boluses 2-5 times per day. Average BG is 185, compared with 233 at the last visit and with 262 at the prior visit.     PAST MEDICAL, FAMILY, AND SOCIAL HISTORY:  Past Medical History  Diagnosis Date  . Type 1 diabetes mellitus not at goal   . Hypoglycemia associated with diabetes   . Goiter   . DM type 1 with diabetic peripheral neuropathy   . Hashimoto's disease   . Hypertension   . Orthostatic hypotension   . Brain cyst     Family History  Problem Relation Age of Onset  . Thyroid disease Maternal Aunt     Hypothyroid, takes thyroid medicine  . Thyroid disease Maternal Grandmother     Sluggish thyroid  . Cancer Paternal Grandfather   . Diabetes Cousin     T1 DM    Current outpatient prescriptions:Bisacodyl (DULCOLAX PO), Take by mouth., Disp: , Rfl: ;  cetirizine  (ZYRTEC) 10 MG tablet, Take 10 mg by mouth daily., Disp: , Rfl: ;  drospirenone-ethinyl estradiol (YAZ,GIANVI,LORYNA) 3-0.02 MG tablet, Take 1 tablet by mouth daily.  , Disp: , Rfl: ;  lisinopril (PRINIVIL,ZESTRIL) 5 MG tablet, Take 1 tablet (5 mg total) by mouth daily., Disp: 30 tablet, Rfl: 11 NOVOLOG 100 UNIT/ML injection, USE 300 UNITS IN INSULIN PUMP EVERY 48 TO 72 HOURS AND PER PROTOCOLSFOR HYPOGLYCEMIA PLUS DKA, Disp: 4 vial, Rfl: 5;  cetirizine-pseudoephedrine (ZYRTEC-D) 5-120 MG per tablet, Take 1 tablet by mouth 2 (two) times daily., Disp: , Rfl:   Allergies as of 11/16/2012 - Review Complete 11/16/2012  Allergen Reaction Noted  . Zithromax (azithromycin) Hives 08/21/2010    1. Work and Family: She graduated from [employer] last Saturday. 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 She will work at [employer] from August to January. Her maternal grandmother was diagnosed with  thyroid cancer last autumn. She had been hypothyroid for many years previously.    2. 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     3. Smoking, alcohol, or drugs: None 4. Primary Care Provider: PA Elpidio Anis at [facility]  REVIEW OF SYSTEMS: She is allergic to raspberries. There are no other significant problems involving Ashlen's other body systems.   Objective:  Vital Signs:  BP 118/72  Pulse 102  Wt 163 lb (73.936 kg)  BMI 25.46 kg/m2   Ht Readings from Last 3 Encounters:  09/16/12 5' 7.09" (1.704 m)  03/11/07 5\' 8"  (1.727 m) (94%*, Z = 1.55)   * Growth percentiles are based on CDC 2-20 Years data.   Wt Readings from Last 3 Encounters:  11/16/12 163 lb (73.936 kg)  09/16/12 170 lb 14.4 oz (77.52 kg)  06/15/12 178 lb 6.4 oz (80.922 kg)  Normalized stature-for-age data available only for age 71 to 20 years. Normalized weight-for-age data available only for age  35 to 20 years.  PHYSICAL EXAM:  Constitutional: The patient appears healthy and well nourished. She has obviously lost 7 more pounds since her last visit. She still appears overweight, but much less so.  Face: The face appears normal.  Eyes: There is no obvious arcus or proptosis. Moisture appears normal. Mouth: The oropharynx and tongue are normal.  Oral moisture is normal. Neck: The neck appears to be visibly enlarged. No carotid bruits are noted. The thyroid gland is diffusely enlarged at 20+ grams in size. The consistency of the thyroid gland is normal. The thyroid gland is not tender to palpation. Lungs: The lungs are clear to auscultation. Air movement is good. Heart: Heart rate and rhythm are regular. Heart sounds S1 and S2 are normal. I did not appreciate any pathologic cardiac murmurs. Abdomen: The abdomen is enlarged. Bowel sounds are normal. There is no obvious hepatomegaly, splenomegaly, or other mass effect.  Arms: Muscle size and bulk are normal for age. Hands: There is no obvious tremor. Phalangeal and metacarpophalangeal joints are normal. Palmar muscles are normal. Palmar skin is normal. Palmar moisture is also normal. Legs: Muscles appear normal for age. No edema is present. Feet: Feet are normally formed. Dorsalis pedal pulses are 1+ on the right and faint 1+ on the left. Neurologic: Strength is normal for age in both the upper and lower extremities. Muscle tone is normal. Sensation to touch is normal in both the legs, but very slightly decreased in the left heel.   LAB DATA: Hemoglobin A1c was 6.9% today, compared with 7.7% an last visit and with 8.0% at the visit prior.   Labs: 06/22/12: Cholesterol 236, triglycerides 300, HDL 68, LDL, 108 (pre-gluten-free diet); TTG IgA 6.4 (<20); IgA 152 (69-380); urinary microalbumin/creatinine ratio 3.1 Labs: 12/25/11: TSH 1.259, free T4 1.26, free T3 3.2; CMP normal          Assessment and Plan:   ASSESSMENT:  1. T1DM: BGs are lower  since starting her gluten-free diet, continuing her weight loss efforts, and working at summer camp. She is now less insulin-resistant and more insulin-sensitive. She is doing better in terms of BG variability. She has not had any BGs > 400 and has not had as many BGs > 300. Most of her higher BGs occur when her sites go bad. She needs to change sites as soon as they begin to go bad.  2. Hypoglycemia: She is having many  more low BGs, mostly associated with being physically active outside in the heat. She needs to use her temporary basal rate function more often.  3. Hypertension: Her BP is very good today associated with more physical activity and with taking lisinopril, 5 mg/day.  4. Goiter/thyroiditis/transient hypothyroidism: According to last visit's labs, her TFTs were mid-range euthyroid. Her thyroid gland is again minimally enlarged today. The waxing and waning of thyroid gland size is c/w evolving Hashimoto's disease. We sent out lab slips on 10/30/12. Mom says she did not receive them. The lab slips may have gone to her old college address.   5. Peripheral neuropathy: With the improvement in blood glucose control overall, she is having no symptoms and only minimal signs of peripheral neuropathy.  6. Dizziness/orthostatic hypotension: Her symptoms recurred once, probably due to dehydration.   7. Autonomic neuropathy and tachycardia: Her HR has decreased in parallel with her decrease in average BG. However, she is still tachycardic. These problems are reversible with lower and steadier BG control. 8. Thyroiditis: Clinically quiescent.  9. Diarrhea: This resolved after starting the gluten-free diet, even though the screening test for celiac disease was negative. She may have a sensitivity to gluten.  10. Episode of bilious vomiting and RUQ pain: Resolved. Her abdominal US on 09/22/12 was entirely normal.  PLAN:  1. Diagnostic: Will re-order CMP, TFTs, lipid panel, and the urine protein.    2.  Therapeutic: Continue lisinopril at 5 mg/day.  Basal rates:  Midnight:  0.500 4 AM: 1.00 9 AM: 0.400 1 PM: 0.475 6 PM: 0.575 10 PM:0.375 3. Patient education: We discussed issues of hyperglycemia, hypoglycemia, thyroiditis, weight gain, dehydration, and hypertension at length. 4. Follow-up: upon return from [place] in January 2015  Level of Service: This visit lasted in excess of 75 minutes. More than 50% of the visit was devoted to counseling.  [name] Stall, MD 11/16/2012 9:29 AM

## 2012-12-08 ENCOUNTER — Ambulatory Visit: Payer: PRIVATE HEALTH INSURANCE | Admitting: "Endocrinology

## 2012-12-13 ENCOUNTER — Encounter (HOSPITAL_BASED_OUTPATIENT_CLINIC_OR_DEPARTMENT_OTHER): Payer: Self-pay | Admitting: *Deleted

## 2012-12-13 ENCOUNTER — Emergency Department (HOSPITAL_BASED_OUTPATIENT_CLINIC_OR_DEPARTMENT_OTHER)
Admission: EM | Admit: 2012-12-13 | Discharge: 2012-12-13 | Disposition: A | Discharge status: Home / Self Care | Payer: PRIVATE HEALTH INSURANCE | Attending: Emergency Medicine | Admitting: Emergency Medicine

## 2012-12-13 DIAGNOSIS — W57XXXA Bitten or stung by nonvenomous insect and other nonvenomous arthropods, initial encounter: Secondary | ICD-10-CM

## 2012-12-13 DIAGNOSIS — L299 Pruritus, unspecified: Secondary | ICD-10-CM | POA: Insufficient documentation

## 2012-12-13 DIAGNOSIS — E1142 Type 2 diabetes mellitus with diabetic polyneuropathy: Secondary | ICD-10-CM | POA: Insufficient documentation

## 2012-12-13 DIAGNOSIS — R110 Nausea: Secondary | ICD-10-CM | POA: Insufficient documentation

## 2012-12-13 DIAGNOSIS — Z8639 Personal history of other endocrine, nutritional and metabolic disease: Secondary | ICD-10-CM | POA: Insufficient documentation

## 2012-12-13 DIAGNOSIS — Y990 Civilian activity done for income or pay: Secondary | ICD-10-CM | POA: Insufficient documentation

## 2012-12-13 DIAGNOSIS — Z8669 Personal history of other diseases of the nervous system and sense organs: Secondary | ICD-10-CM | POA: Insufficient documentation

## 2012-12-13 DIAGNOSIS — Y9289 Other specified places as the place of occurrence of the external cause: Secondary | ICD-10-CM | POA: Insufficient documentation

## 2012-12-13 DIAGNOSIS — I10 Essential (primary) hypertension: Secondary | ICD-10-CM | POA: Insufficient documentation

## 2012-12-13 DIAGNOSIS — E1149 Type 2 diabetes mellitus with other diabetic neurological complication: Secondary | ICD-10-CM | POA: Insufficient documentation

## 2012-12-13 DIAGNOSIS — E1169 Type 2 diabetes mellitus with other specified complication: Secondary | ICD-10-CM | POA: Insufficient documentation

## 2012-12-13 DIAGNOSIS — IMO0001 Reserved for inherently not codable concepts without codable children: Secondary | ICD-10-CM | POA: Insufficient documentation

## 2012-12-13 DIAGNOSIS — Y9389 Activity, other specified: Secondary | ICD-10-CM | POA: Insufficient documentation

## 2012-12-13 DIAGNOSIS — Z862 Personal history of diseases of the blood and blood-forming organs and certain disorders involving the immune mechanism: Secondary | ICD-10-CM | POA: Insufficient documentation

## 2012-12-13 DIAGNOSIS — Z79899 Other long term (current) drug therapy: Secondary | ICD-10-CM | POA: Insufficient documentation

## 2012-12-13 MED ORDER — DOXYCYCLINE HYCLATE 50 MG PO CAPS: 100.0000 mg | ORAL_CAPSULE | Freq: Two times a day (BID) | ORAL | Status: AC

## 2012-12-13 MED ORDER — DOXYCYCLINE HYCLATE 100 MG PO TABS
100.0000 mg | ORAL_TABLET | Freq: Once | ORAL | Status: AC
  Administered 2012-12-13: 100 mg via ORAL
  Filled 2012-12-13: qty 1

## 2012-12-13 NOTE — ED Provider Notes (Signed)
CSN: [id number]     Arrival date & time 12/13/12  1419 History  This chart was scribed for Gerhard Munch, MD by Ardelia Mems, ED Scribe. This patient was seen in room MH12/MH12 and the patient's care was started at 3:07 PM.   First MD Initiated Contact with Patient 12/13/12 1459     Chief Complaint  Patient presents with  . Insect Bite    The history is provided by the patient. No language interpreter was used.   HPI Comments: Victoria Atkinson is a 22 y.o. Female with a history of DM who presents to the Emergency Department complaining of increased redness to an insect bite to her left arm, over her bicep, with associated itching. 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 She expresses concern that she may have been bit by a tick. She states that she first notice the bite 3 days ago as an area of redness, which has increased in size of the oast 2 days to include a surround target lesion. She states that she has used Benadryl Spray without relief. She reports asscociated nausea yesterday which has subsided. She states that she has a history of DM, which is well-controlled with Novolog, and she states that she is otherwise healthy. She denies fever, chills, vomiting, confusion, syncope, bladder or bowel incontinence, rash elsewhere on her body or any other symptoms.  PCP- Dr. Windy Carina  Past Medical History  Diagnosis Date  . Type 1 diabetes mellitus not at goal   . Hypoglycemia associated with diabetes   . Goiter   . DM type 1 with diabetic peripheral neuropathy   . Hashimoto's disease   . Hypertension   . Orthostatic hypotension   . Brain cyst    Past Surgical History  Procedure Laterality Date  . None     Family History  Problem Relation Age of Onset  . Thyroid disease Maternal Aunt     Hypothyroid, takes thyroid medicine  . Thyroid disease Maternal Grandmother     Sluggish  thyroid  . Cancer Paternal Grandfather   . Diabetes Cousin     T1 DM   History  Substance Use Topics  . Smoking status: Never Smoker   . Smokeless tobacco: Never Used  . Alcohol Use: No   OB History   Grav Para Term Preterm Abortions TAB SAB Ect Mult Living                 Review of Systems  Constitutional: Negative for fever and chills.       Per HPI, otherwise negative  HENT:       Per HPI, otherwise negative  Respiratory:       Per HPI, otherwise negative  Cardiovascular:       Per HPI, otherwise negative  Gastrointestinal: Positive for nausea. Negative for vomiting.  Endocrine:       Negative aside from HPI  Genitourinary:       Neg aside from HPI   Musculoskeletal:       Per HPI, otherwise negative  Skin: Negative.   Neurological: Negative for syncope.  Psychiatric/Behavioral: Negative for confusion.  All other systems reviewed and are negative.   Allergies  Zithromax  Home Medications   Current Outpatient Rx  Name  Route  Sig  Dispense  Refill  . Bisacodyl (DULCOLAX PO)   Oral  Take by mouth.         . cetirizine (ZYRTEC) 10 MG tablet   Oral   Take 10 mg by mouth daily.         . cetirizine-pseudoephedrine (ZYRTEC-D) 5-120 MG per tablet   Oral   Take 1 tablet by mouth 2 (two) times daily.         . drospirenone-ethinyl estradiol (YAZ,GIANVI,LORYNA) 3-0.02 MG tablet   Oral   Take 1 tablet by mouth daily.           Marland Kitchen lisinopril (PRINIVIL,ZESTRIL) 5 MG tablet   Oral   Take 1 tablet (5 mg total) by mouth daily.   30 tablet   11   . NOVOLOG 100 UNIT/ML injection      USE 300 UNITS IN INSULIN PUMP EVERY 48 TO 72 HOURS AND PER PROTOCOLSFOR HYPOGLYCEMIA PLUS DKA   4 vial   5     Dispense as written.    Triage Vitals: BP 125/77  Pulse 101  Temp(Src) 98.2 F (36.8 C) (Oral)  Resp 16  Ht 5\' 7"  (1.702 m)  Wt 169 lb (76.658 kg)  BMI 26.46 kg/m2  SpO2 100%  LMP 12/02/2012  Physical Exam  Nursing note and vitals  reviewed. Constitutional: She is oriented to person, place, and time. She appears well-developed and well-nourished. No distress.  HENT:  Head: Normocephalic and atraumatic.  Eyes: Conjunctivae and EOM are normal.  Cardiovascular: Normal rate and regular rhythm.   Pulmonary/Chest: Effort normal and breath sounds normal. No stridor. No respiratory distress.  Abdominal: She exhibits no distension.  Musculoskeletal: She exhibits no edema.  Neurological: She is alert and oriented to person, place, and time. No cranial nerve deficit.  Skin: Skin is warm and dry.  Punctate lesion on her medial middle upper left arm, with a surrounding target lesion.  Psychiatric: She has a normal mood and affect.    ED Course   Procedures (including critical care time)  DIAGNOSTIC STUDIES: Oxygen Saturation is 100% on RA, normal by my interpretation.    COORDINATION OF CARE: 3:24 PM- Pt advised of option to be treated for Lyme's disease and/or RMSF and she agrees with this plan.  Labs Reviewed - No data to display  No results found.  1. Insect bite     MDM   I personally performed the services described in this documentation, which was scribed in my presence. The recorded information has been reviewed and is accurate.    With the presence of a turgor lesion on the patient's left upper arm, and her concern for Lyme disease for struck amounts fever, we initiated empiric therapy.  Early in the course, labs are unlikely to add diagnostic value.  Patient discharged with return precautions, primary care followup.  Gerhard Munch, MD 12/13/12 1610

## 2012-12-13 NOTE — ED Notes (Signed)
Patient states that her upper arm was bitten by something, unsure what. The area is now reddened and swelling. Works at summer camp.

## 2012-12-21 ENCOUNTER — Ambulatory Visit: Payer: PRIVATE HEALTH INSURANCE | Admitting: "Endocrinology

## 2012-12-24 ENCOUNTER — Other Ambulatory Visit: Payer: Self-pay | Admitting: "Endocrinology

## 2013-01-07 ENCOUNTER — Telehealth: Payer: Self-pay | Admitting: *Deleted

## 2013-01-07 NOTE — Telephone Encounter (Signed)
Attempted to reach both Mother and Ajai re. [employer] Orders.  No answer and no voice mail.  There will not be a physician available to sign the orders until Tuesday 01/12/13. They will then be faxed to [facility].

## 2013-04-20 ENCOUNTER — Other Ambulatory Visit: Payer: Self-pay | Admitting: *Deleted

## 2013-04-20 DIAGNOSIS — E1065 Type 1 diabetes mellitus with hyperglycemia: Secondary | ICD-10-CM

## 2013-05-24 ENCOUNTER — Ambulatory Visit: Payer: PRIVATE HEALTH INSURANCE | Admitting: "Endocrinology

## 2013-05-27 ENCOUNTER — Telehealth: Payer: Self-pay | Admitting: *Deleted

## 2013-05-27 NOTE — Telephone Encounter (Signed)
Mother calling: 1. [name] is at college in [place]. Her Medtronic  2. Her 723 Insulin Pump is out of warranty, or soon to be. 3. Braeley wants to order the Medtronic Minimed 530G/751 Insulin Pump with Enlite. 4. Their BCBS[employer] starts a new year on 07/11/13. They want to purchase the pump/sensor prior to that. 5. [employer] will be their distributor. 6. I gave Mom Merrilee SeashoreMario Hildebrand's (our Medtr[employer] DTA) phone number, [phone]  [phone]. I instructed Mom to call him. Marquita PalmsMario will get the required information he needs   to get authorization, and then forward everything to [employer]. 7. They are concerned about how Lativia will get training on her 530G and Enlite Sensor.  I told her [employer] has trainers in the [place] area. There should be no problem.

## 2013-06-16 IMAGING — US US ABDOMEN COMPLETE
1 series · 14 of 25 positions shown · non-contrast
Comparison: CT abdomen pelvis of 10/15/2009

CLINICAL DATA: Right upper quadrant pain radiating to the back

COMPLETE ABDOMINAL ULTRASOUND

[Series 1: us abdomen complete · 0.24mm/px · 14 of 85 slices shown]
[im 1/85]
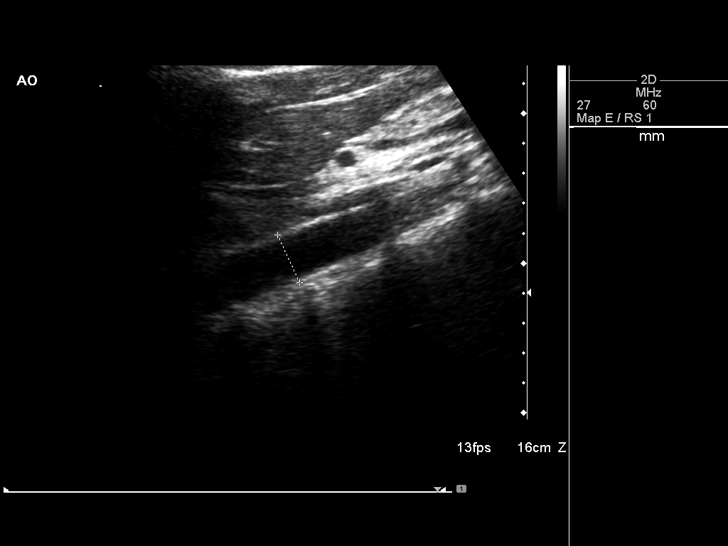
[im 8/85]
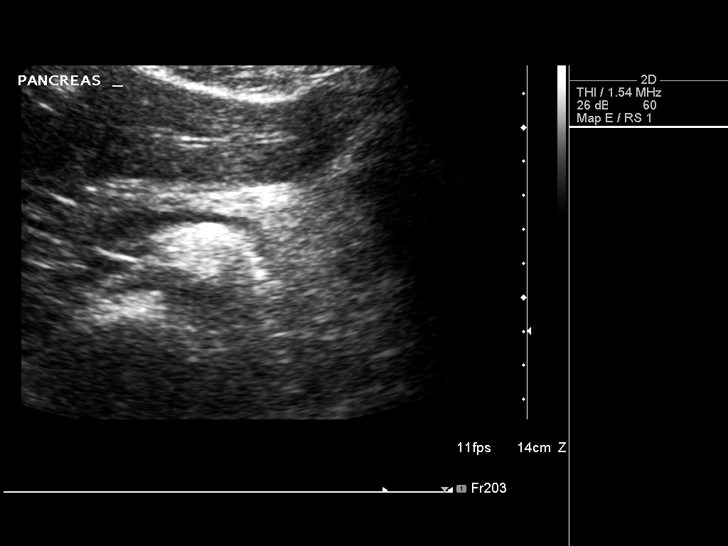
[im 15/85]
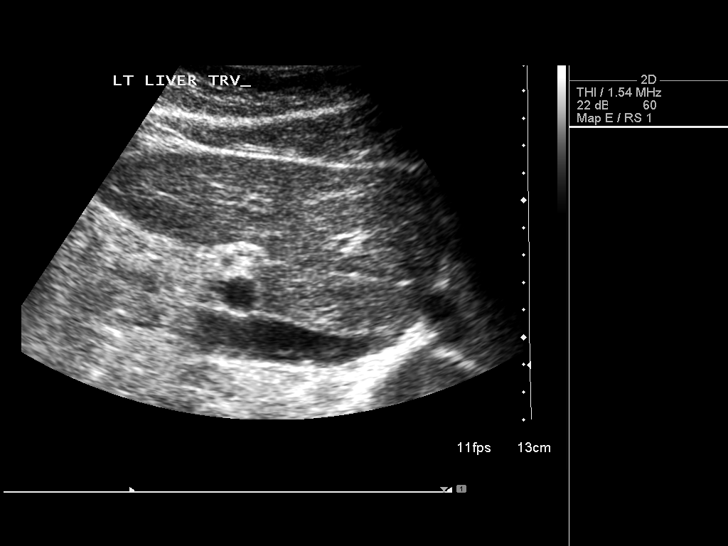
[im 22/85]
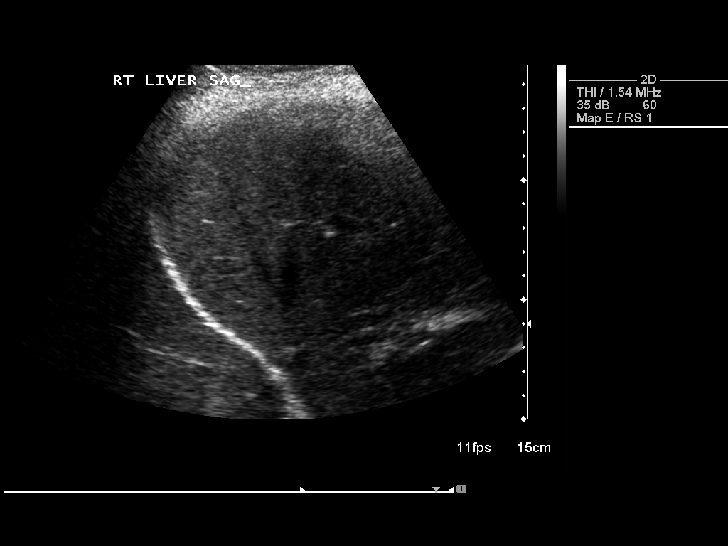
[im 29/85]
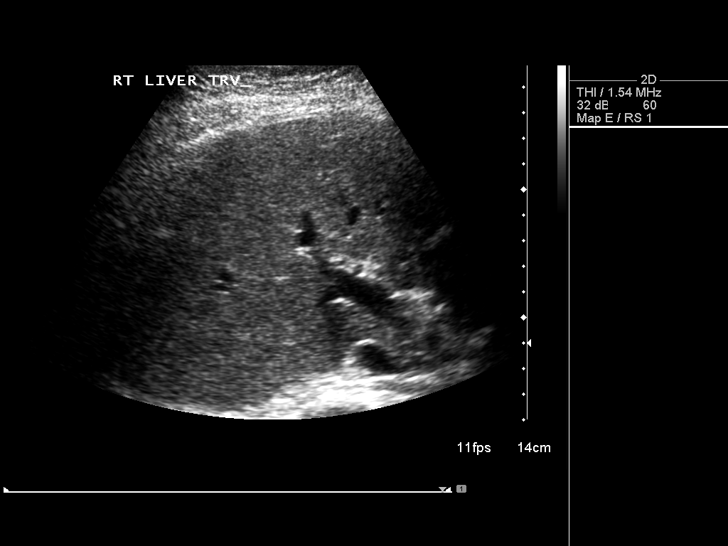
[im 32/85]
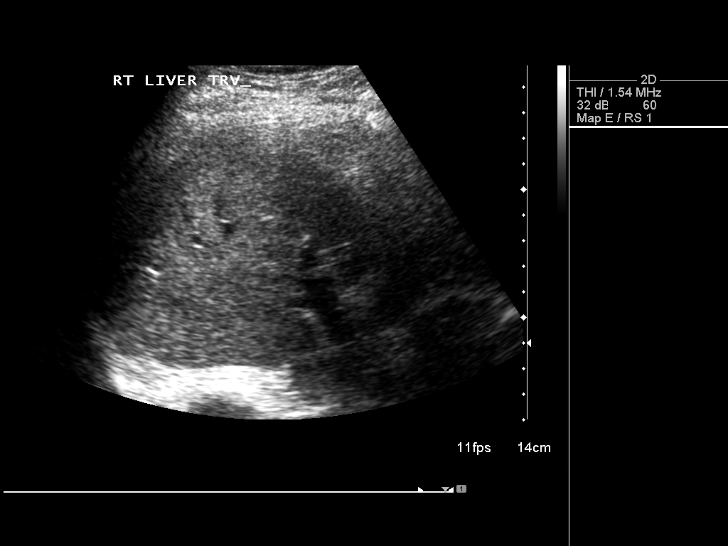
[im 39/85]
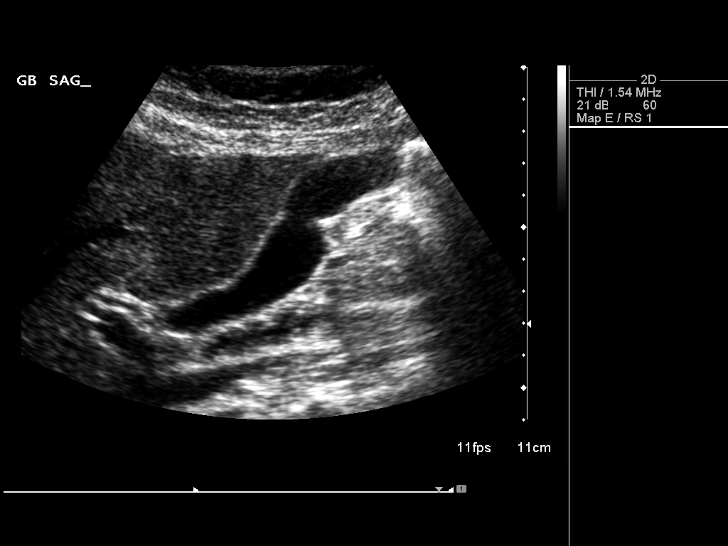
[im 46/85]
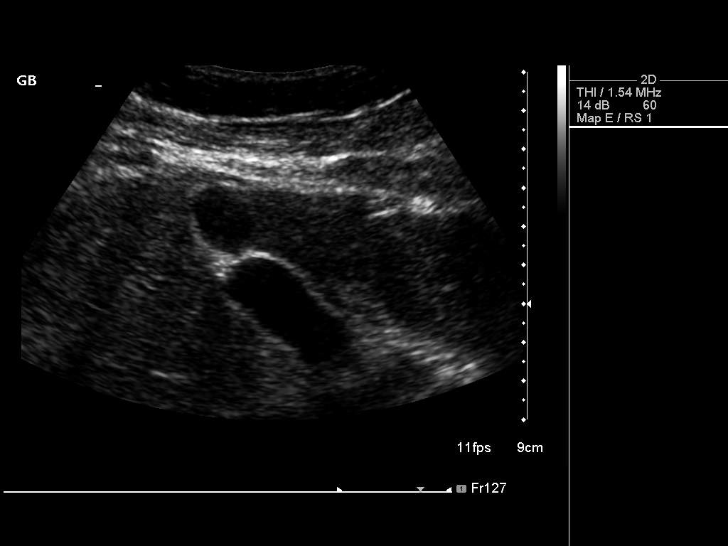
[im 53/85]
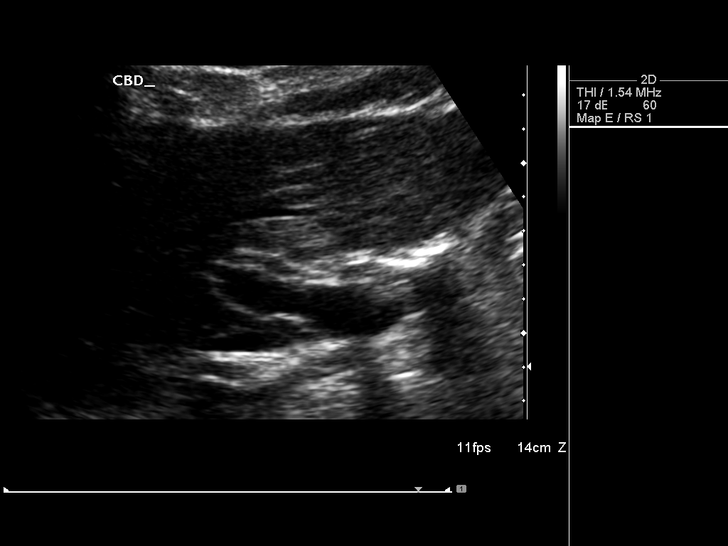
[im 57/85]
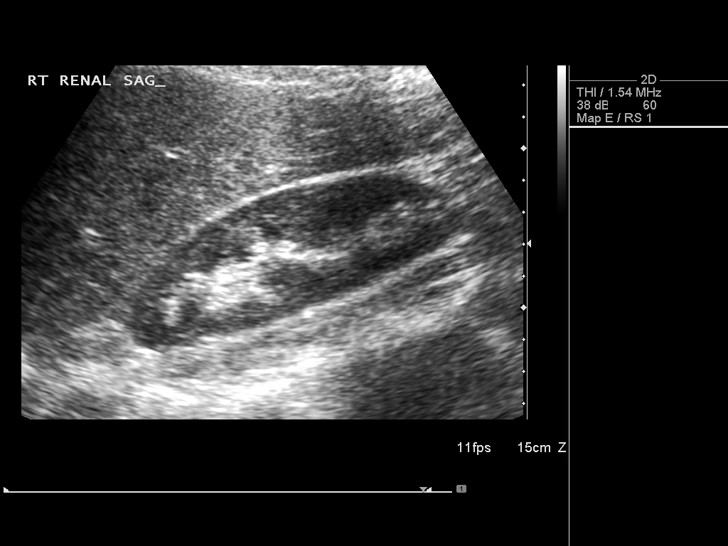
[im 64/85]
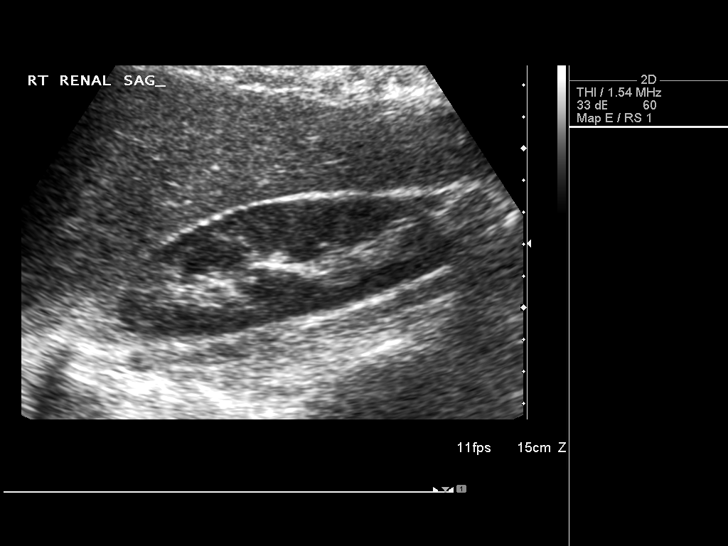
[im 71/85]
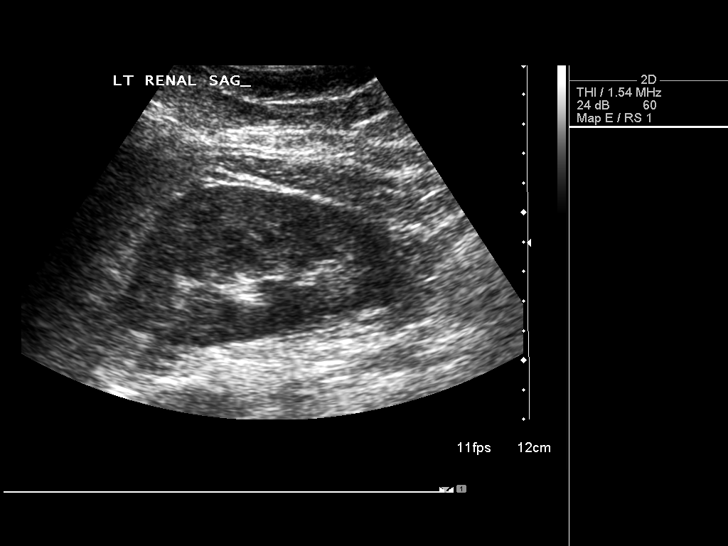
[im 78/85]
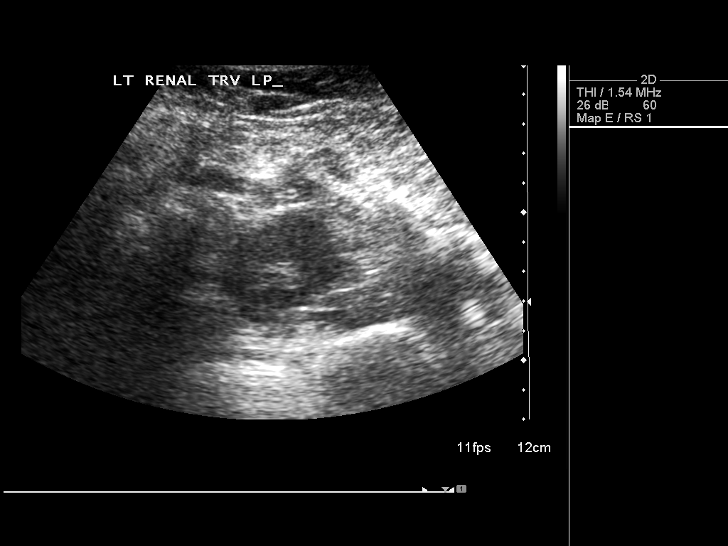
[im 85/85]
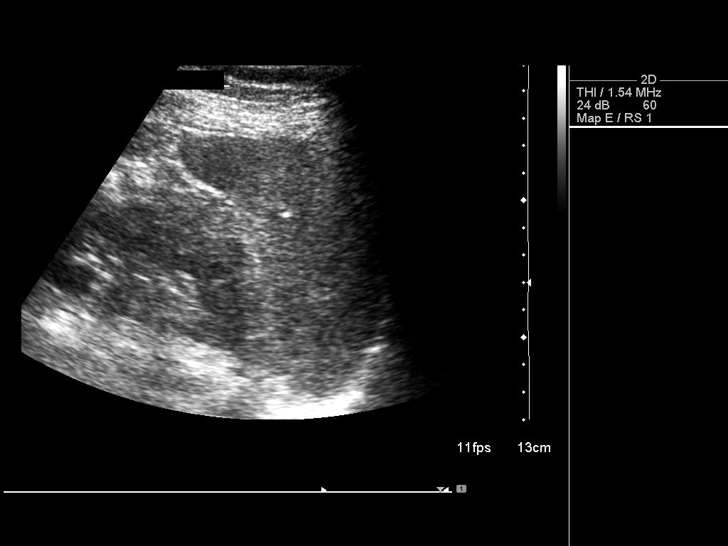

[14 of 25 positions shown; findings below may reference images not displayed]

FINDINGS: Gallbladder:  The gallbladder is visualized and no gallstones are
noted.  There is no pain over the gallbladder with compression.

Common bile duct:  The common bile duct is normal measuring 3.8 mm
in diameter.

Liver:  The liver has a normal echogenic pattern.  No focal
abnormality is seen.

IVC:  Appears normal.

Pancreas:  No focal abnormality seen.

Spleen:  The spleen is normal measuring 8.2 cm sagittally.

Right Kidney:  No hydronephrosis is seen.  The right kidney
measures 11.3 cm sagittally.

Left Kidney:  No hydronephrosis is noted.  The left kidney measures
10.9 cm.

Abdominal aorta:  The abdominal aorta is normal in caliber although
obscured distally by bowel gas.
IMPRESSION: Negative abdominal aorta.  No gallstones.

## 2013-10-20 ENCOUNTER — Other Ambulatory Visit: Payer: Self-pay | Admitting: *Deleted

## 2013-10-20 DIAGNOSIS — E1065 Type 1 diabetes mellitus with hyperglycemia: Secondary | ICD-10-CM

## 2013-10-20 DIAGNOSIS — IMO0002 Reserved for concepts with insufficient information to code with codable children: Secondary | ICD-10-CM

## 2013-10-20 MED ORDER — NOVOLOG 100 UNIT/ML ~~LOC~~ SOLN: SUBCUTANEOUS | Status: DC

## 2013-11-13 ENCOUNTER — Other Ambulatory Visit: Payer: Self-pay | Admitting: "Endocrinology

## 2014-01-21 ENCOUNTER — Other Ambulatory Visit: Payer: Self-pay | Admitting: *Deleted

## 2014-01-21 DIAGNOSIS — E1065 Type 1 diabetes mellitus with hyperglycemia: Secondary | ICD-10-CM

## 2014-01-21 DIAGNOSIS — IMO0002 Reserved for concepts with insufficient information to code with codable children: Secondary | ICD-10-CM

## 2014-01-25 LAB — COMPREHENSIVE METABOLIC PANEL
ALBUMIN: 3.9 g/dL (ref 3.5–5.2)
ALK PHOS: 88 U/L (ref 39–117)
ALT: 10 U/L (ref 0–35)
AST: 15 U/L (ref 0–37)
BILIRUBIN TOTAL: 0.4 mg/dL (ref 0.2–1.2)
BUN: 14 mg/dL (ref 6–23)
CO2: 27 mEq/L (ref 19–32)
CREATININE: 0.82 mg/dL (ref 0.50–1.10)
Calcium: 9.4 mg/dL (ref 8.4–10.5)
Chloride: 99 mEq/L (ref 96–112)
Glucose, Bld: 178 mg/dL — ABNORMAL HIGH (ref 70–99)
Potassium: 4.1 mEq/L (ref 3.5–5.3)
Sodium: 137 mEq/L (ref 135–145)
Total Protein: 7 g/dL (ref 6.0–8.3)

## 2014-01-25 LAB — LIPID PANEL
CHOL/HDL RATIO: 2.9 ratio
Cholesterol: 225 mg/dL — ABNORMAL HIGH (ref 0–200)
HDL: 77 mg/dL (ref >39)
LDL CALC: 93 mg/dL (ref 0–99)
TRIGLYCERIDES: 276 mg/dL — AB (ref <150)
VLDL: 55 mg/dL — AB (ref 0–40)

## 2014-01-25 LAB — T3, FREE: T3 FREE: 2.9 pg/mL (ref 2.3–4.2)

## 2014-01-25 LAB — T4, FREE: FREE T4: 1.06 ng/dL (ref 0.80–1.80)

## 2014-01-25 LAB — HEMOGLOBIN A1C
HEMOGLOBIN A1C: 8.4 % — AB (ref <5.7)
Mean Plasma Glucose: 194 mg/dL — ABNORMAL HIGH (ref <117)

## 2014-01-25 LAB — TSH: TSH: 2.597 u[IU]/mL (ref 0.350–4.500)

## 2014-01-26 LAB — MICROALBUMIN / CREATININE URINE RATIO
Creatinine, Urine: 255.3 mg/dL
MICROALB/CREAT RATIO: 2.6 mg/g (ref 0.0–30.0)
Microalb, Ur: 0.66 mg/dL (ref 0.00–1.89)

## 2014-01-31 ENCOUNTER — Ambulatory Visit (INDEPENDENT_AMBULATORY_CARE_PROVIDER_SITE_OTHER): Payer: PRIVATE HEALTH INSURANCE | Admitting: "Endocrinology

## 2014-01-31 ENCOUNTER — Encounter: Payer: Self-pay | Admitting: "Endocrinology

## 2014-01-31 VITALS — BP 122/84 | HR 96 | Wt 177.0 lb

## 2014-01-31 DIAGNOSIS — G909 Disorder of the autonomic nervous system, unspecified: Secondary | ICD-10-CM

## 2014-01-31 DIAGNOSIS — R197 Diarrhea, unspecified: Secondary | ICD-10-CM

## 2014-01-31 DIAGNOSIS — E1043 Type 1 diabetes mellitus with diabetic autonomic (poly)neuropathy: Secondary | ICD-10-CM

## 2014-01-31 DIAGNOSIS — E1069 Type 1 diabetes mellitus with other specified complication: Secondary | ICD-10-CM

## 2014-01-31 DIAGNOSIS — E10649 Type 1 diabetes mellitus with hypoglycemia without coma: Secondary | ICD-10-CM

## 2014-01-31 DIAGNOSIS — IMO0002 Reserved for concepts with insufficient information to code with codable children: Secondary | ICD-10-CM

## 2014-01-31 DIAGNOSIS — E049 Nontoxic goiter, unspecified: Secondary | ICD-10-CM

## 2014-01-31 DIAGNOSIS — E1142 Type 2 diabetes mellitus with diabetic polyneuropathy: Secondary | ICD-10-CM

## 2014-01-31 DIAGNOSIS — E1042 Type 1 diabetes mellitus with diabetic polyneuropathy: Secondary | ICD-10-CM

## 2014-01-31 DIAGNOSIS — E162 Hypoglycemia, unspecified: Secondary | ICD-10-CM

## 2014-01-31 DIAGNOSIS — Z23 Encounter for immunization: Secondary | ICD-10-CM

## 2014-01-31 DIAGNOSIS — E063 Autoimmune thyroiditis: Secondary | ICD-10-CM

## 2014-01-31 DIAGNOSIS — R000 Tachycardia, unspecified: Secondary | ICD-10-CM

## 2014-01-31 DIAGNOSIS — I498 Other specified cardiac arrhythmias: Secondary | ICD-10-CM

## 2014-01-31 DIAGNOSIS — E1049 Type 1 diabetes mellitus with other diabetic neurological complication: Secondary | ICD-10-CM

## 2014-01-31 DIAGNOSIS — E1065 Type 1 diabetes mellitus with hyperglycemia: Secondary | ICD-10-CM

## 2014-01-31 LAB — GLUCOSE, POCT (MANUAL RESULT ENTRY): POC GLUCOSE: 172 mg/dL — AB (ref 70–99)

## 2014-01-31 LAB — POCT GLYCOSYLATED HEMOGLOBIN (HGB A1C): Hemoglobin A1C: 7.5

## 2014-01-31 NOTE — Patient Instructions (Signed)
Follow up visit in 3 months. 

## 2014-01-31 NOTE — Progress Notes (Addendum)
Subjective:  Patient Name: Victoria Atkinson Date of Birth: 20-Jan-1991  MRN: [id number]  [name]  presents to the office today for follow-up of her type 1 diabetes mellitus, goiter, hypoglycemia, peripheral neuropathy, autonomic neuropathy with inappropriate sinus tachycardia, headaches, Hashimoto's disease, and hypertension.  HISTORY OF PRESENT ILLNESS:   Victoria Atkinson is a 23 y.o. Caucasian young woman.  Victoria Atkinson was unaccompanied.   1. The patient was first referred to me on 10/31/04 by Dr. Crista Luria from [facility] for evaluation and management of new onset type 1 diabetes mellitus. The patient was 23 years old.   A. The patient had about a 2 month period of polyuria, polydipsia, nocturia, upset stomach, nausea, stomach pains, and a documented 17 pound weight loss. She was often dizzy and lightheaded. At her PCP's office a serum glucose was 357. A serum CO2 was 26. I arranged to see the patient that day. 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 Family history was positive for a cousin with type 1 diabetes mellitus, a maternal grandmother with a "sluggish" thyroid, and a maternal aunt who took thyroid medicine. On physical examination her height was 65-1/2 inches. She weighed 99-1/2 pounds. Her BMI was 16.4. Her CBG was high, which was greater than 500 on our meter. Her hemoglobin A1c was 12.6%.  B. We provided diabetes education to the patient and her family in our office. I started her on Novolog by sliding scale. I subsequently changed the Novolog to our two-component method with a correction dose and food dose at mealtimes. I also added Lantus insulin.  2. During the past nine years, the patient has done well overall. She had a good honeymoon period that lasted about 9 months, during which her hemoglobin A1c's varied from 6.0-6.3%. On 08/23/2005 we started her on insulin pump therapy with a Medtronic Paradigm 722 insulin pump. Since then, her  hemoglobin A1c values have varied from 6.5-8.3%.   3. On 10/21/2005, as part of an evaluation for headaches, an MRI was performed. A 3.1 x 6.2 x 11.0 cm posterior fossa arachnoid cyst was noted. Subsequent evaluation by [facility]sur[facility] at [facility] and at [facility] felt that she had a mega cisterna magna rather than a cyst. This was considered to be normal variant and was not felt to be causing her headaches. She was told that surgery would not be required.  4. The patient's last [facility] visit was on 11/16/12. In the interim, she has been healthy , except for IBS and swelling and pain in her hands and knees. .   A. She was hospitalized when she was in [place] with bloody vomiting. She is still following the gluten-free diet. Since going gluten-free she has gradually become better at counting carbs. She has not had frequent left frontal headaches if she remains hydrated.   B. She has an [employer] CGM sensor that works well, but she only uses it intermittently.   C. Camp food caused more fluctuations in her BGs, as well as constipation.   D. She uses Novolog aspart in her pump. She was supposed to be taking lisinopril, 5 mg/day, but stopped it about one month ago.   5. Pertinent Review of Systems:  Constitutional: The patient feels "pretty good". She has been healthy.  Eyes: Vision is good with her glasses. Her last exam was last month in May of this year. She had no signs of diabetic eye disease. There are no significant eye complaints.  Mouth: She frequently misses her vitamins, so her geographic tongue comes and goes.   Neck: The patient has frequent complaints of anterior neck swelling and soreness. She thinks that her goiter has decreased in size. Heart: Heart rate increases with exercise or other physical activity. The patient has no complaints of palpitations, irregular heat beats, chest pain, or chest pressure. Gastrointestinal: She has a fair amount of abdominal gas and  bloating, plus alternating diarrhea and constipation. .She still has a little bit of reflux at times, especially if she has acidotic foods. The patient has no complaints of excessive hunger, acid reflux, upset stomach, stomach aches or other pains, diarrhea, or constipation. Legs: She has frequent pain and swelling of her left knee. Muscle mass and strength seem normal. There are no other complaints of numbness, tingling, burning, or pain. No edema is noted. Feet: There are no obvious foot problems. There are no other complaints of numbness, tingling, burning, or pain. No edema is noted. GYN: She remains on oral contraceptive pills. LMP was 4 weeks ago. Hypoglycemia: She is not having hypoglycemia very often. None have been severe.    6. BG printout: BGs are quite variable. Her AM BGs tend to be around 190. BGs increase after lunch, then tend to decrease in the late afternoon when she exercises. She has been having some low BGs in the afternoons and especially in the evenings after working out. Most of her 300s are associated with poor sites. When her sites are working, her BGs are much letter, sometimes too low. She is doing much better at checking her BGs at bedtime. She occasionally intentionally does not take a bolus if she feels that she will be excessively active. Her largest spread of BGs is about 5 PM. She checks BGs 2-5 times per day and boluses 2-5 times per day. Average BG is 190, compared with 185 at the last visit and with 233 at the prior visit.     PAST MEDICAL, FAMILY, AND SOCIAL HISTORY:  Past Medical History  Diagnosis Date  . Type 1 diabetes mellitus not at goal   . Hypoglycemia associated with diabetes   . Goiter   . DM type 1 with diabetic peripheral neuropathy   . Hashimoto's disease   . Hypertension   . Orthostatic hypotension   . Brain cyst     Family History  Problem Relation Age of Onset  . Thyroid disease Maternal Aunt     Hypothyroid, takes thyroid medicine  .  Thyroid disease Maternal Grandmother     Sluggish thyroid  . Cancer Paternal Grandfather   . Diabetes Cousin     T1 DM    Current outpatient prescriptions:dicyclomine (BENTYL) 20 MG tablet, Take 20 mg by mouth every 6 (six) hours., Disp: , Rfl: ;  drospirenone-ethinyl estradiol (YAZ,GIANVI,LORYNA) 3-0.02 MG tablet, Take 1 tablet by mouth daily.  , Disp: , Rfl: ;  lisinopril (PRINIVIL,ZESTRIL) 5 MG tablet, TAKE ONE TABLET BY MOUTH ONE TIME DAILY, Disp: 30 tablet, Rfl: 9;  loratadine (CLARITIN) 10 MG tablet, Take 10 mg by mouth daily., Disp: , Rfl:  NOVOLOG 100 UNIT/ML injection, Use 300 units every 48 hours in insulin pump, Disp: 5 vial, Rfl: 6;  Bisacodyl (DULCOLAX PO), Take by mouth., Disp: , Rfl: ;  cetirizine (ZYRTEC) 10 MG tablet, Take 10 mg by mouth daily., Disp: , Rfl: ;  cetirizine-pseudoephedrine (ZYRTEC-D) 5-120 MG per tablet, Take 1 tablet by mouth 2 (two) times daily., Disp: , Rfl:   Allergies as of  01/31/2014 - Review Complete 01/31/2014  Allergen Reaction Noted  . Zithromax [azithromycin] Hives 08/21/2010    1. Work and Family: She graduated from [employer] one year ago. She worked at [employer] from August 2014 to January 2015. 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 Her maternal grandmother was diagnosed with thyroid cancer and had her thyroidectomy. She had been hypothyroid for many years previously.    2. Activities: Victoria Atkinson has been working out more with her elliptical machine and walking, usually in the late afternoon. 3. Smoking, alcohol, or drugs: None 4. Primary Care Provider: Dr. Everlene Other at [facility] at [facility]  REVIEW OF SYSTEMS: She is allergic to raspberries and poison ivy. There are no other significant problems involving Victoria Atkinson's other body systems.   Objective:  Vital Signs:  BP 122/84  Pulse 96  Wt 177 lb (80.287 kg)   Ht Readings from Last 3  Encounters:  12/13/12  (1.702 m)  09/16/12 5' 7.09" (1.704 m)  03/11/07  (1.727 m) (94%*, Z = 1.55)   * Growth percentiles are based on CDC 2-20 Years data.   Wt Readings from Last 3 Encounters:  01/31/14 177 lb (80.287 kg)  12/13/12 169 lb (76.658 kg)  11/16/12 163 lb (73.936 kg)  Normalized stature-for-age data available only for age 8 to 20 years. Normalized weight-for-age data available only for age 8 to 20 years.  PHYSICAL EXAM:  Constitutional: The patient appears healthy and well nourished. She has gained 8 pounds in the past year. She still appears overweight. Her diastolic BP is elevated since discontinuing lisinopril. Face: The face appears normal.  Eyes: There is no obvious arcus or proptosis. Moisture appears normal. Mouth: The oropharynx is normal. Her tongue shows one central area of geographic tongue. Oral moisture is normal. Neck: The neck appears to be visibly enlarged. No carotid bruits are noted. The thyroid gland is somewhat more enlarged at 21-22 grams in size. The consistency of the thyroid gland is normal. The thyroid gland is not tender to palpation. Lungs: The lungs are clear to auscultation. Air movement is good. Heart: Heart rate and rhythm are regular. Heart sounds S1 and S2 are normal. I did not appreciate any pathologic cardiac murmurs. Abdomen: The abdomen is enlarged. Bowel sounds are normal. There is no obvious hepatomegaly, splenomegaly, or other mass effect.  Arms: Muscle size and bulk are normal for age. Hands: There is no obvious tremor. Phalangeal and metacarpophalangeal joints are normal. Palmar muscles are normal. Palmar skin is normal. Palmar moisture is also normal. Legs: Muscles appear normal for age. No edema is present. Feet: Feet are normally formed. Dorsalis pedal pulses are trace bilaterally. PT pulses are 1+ bilaterally.  Neurologic: Strength is normal for age in both the upper and lower extremities. Muscle tone is normal.  Sensation to touch is normal in both the legs and in both feet.   LAB DATA: Hemoglobin A1c was 7.5% today, compared with 6.9% an last visit and with 7.7% at the visit prior.    Labs 01/25/14: HbA1c 8.4%; CMP normal except for glucose of 178; cholesterol 225, triglycerides 276, HDL 77, LDL 92; microalbumin/creatinine ratio 2.6; TSH 2.597, free T4 1.06, free T3 2.9  Labs: 06/22/12: Cholesterol 236, triglycerides 300, HDL 68, LDL, 108 (pre-gluten-free diet); TTG IgA 6.4 (<20); IgA 152 (69-380); urinary microalbumin/creatinine ratio 3.1  Labs: 12/25/11: TSH 1.259, free T4  1.26, free T3 3.2; CMP normal          Assessment and Plan:   ASSESSMENT:  1. T1DM: BGs are higher this visit. She is not having as many low BGs, so her average BGs are higher.  She is now more insulin-resistant and less insulin-sensitive. She is doing about the same in terms of BG variability. She has had 4 BGs > 400. Most of her higher BGs occur when her sites go bad. She needs to change sites as soon as they begin to go bad.  2. Hypoglycemia: She is having some low BGs, mostly associated with being physically active. She needs to use her temporary basal rate function more often.  3. Hypertension: Her systolic BP is good today. Her diastolic BP is high. She stopped taking lisinopril last month.  4. Goiter/thyroiditis/transient hypothyroidism: Her TFTs were mid-range euthyroid in August 2013. Her recent TFTs are still euthyroid, but are now at about the 15% of the normal range. Her goiter is a bit larger today than at last visit. The waxing and waning of thyroid gland size and the bouncing of TFTs are c/w evolving Hashimoto's disease.  5. Peripheral neuropathy: Although her BGs are higher, her neuropathy has improved. It is normal to have a lag period between BG changes and neurological findings.  6. Dizziness/orthostatic hypotension: Her symptoms have resolved.   7. Autonomic neuropathy and tachycardia: Her HR has decreased.. 8.  Thyroiditis: Clinically quiescent.  9. Diarrhea: This problem has largely resolved. She may have a sensitivity to gluten. She may also benefit from Miralax daily.  10. Episode of bilious vomiting and RUQ pain: Resolved.  11. Geographic tongue: It is important for her to take her multivitamins.  PLAN: 1. Diagnostic: Will re-order CMP, TFTs, lipid panel, and the urine protein in 6 months..    2. Therapeutic: Resume lisinopril at 5 mg/day.  New basal rates:  Midnight:  0.500 -> 0.550 4 AM: 1.00 -> 1.10 9 AM: 0.400 -> 0.450 1 PM: 0.475 ->  0.525 6 PM: 0.575 -> 0.600 10 PM:0.375  3. Patient education: We discussed issues of hyperglycemia, hypoglycemia, thyroiditis, weight gain, dehydration, and hypertension at length. 4. Follow-up: 3 months  Level of Service: This visit lasted in excess of 75 minutes. More than 50% of the visit was devoted to counseling.  [name] Stall, MD 01/31/2014 3:08 PM

## 2014-05-10 ENCOUNTER — Encounter: Payer: Self-pay | Admitting: "Endocrinology

## 2014-05-10 ENCOUNTER — Ambulatory Visit (INDEPENDENT_AMBULATORY_CARE_PROVIDER_SITE_OTHER): Payer: PRIVATE HEALTH INSURANCE | Admitting: "Endocrinology

## 2014-05-10 VITALS — BP 111/76 | HR 108 | Wt 174.0 lb

## 2014-05-10 DIAGNOSIS — E10649 Type 1 diabetes mellitus with hypoglycemia without coma: Secondary | ICD-10-CM

## 2014-05-10 DIAGNOSIS — I10 Essential (primary) hypertension: Secondary | ICD-10-CM

## 2014-05-10 DIAGNOSIS — E063 Autoimmune thyroiditis: Secondary | ICD-10-CM

## 2014-05-10 DIAGNOSIS — E1042 Type 1 diabetes mellitus with diabetic polyneuropathy: Secondary | ICD-10-CM

## 2014-05-10 DIAGNOSIS — I471 Supraventricular tachycardia: Secondary | ICD-10-CM

## 2014-05-10 DIAGNOSIS — R000 Tachycardia, unspecified: Secondary | ICD-10-CM

## 2014-05-10 DIAGNOSIS — E1065 Type 1 diabetes mellitus with hyperglycemia: Secondary | ICD-10-CM

## 2014-05-10 DIAGNOSIS — E049 Nontoxic goiter, unspecified: Secondary | ICD-10-CM

## 2014-05-10 DIAGNOSIS — IMO0002 Reserved for concepts with insufficient information to code with codable children: Secondary | ICD-10-CM

## 2014-05-10 DIAGNOSIS — E1043 Type 1 diabetes mellitus with diabetic autonomic (poly)neuropathy: Secondary | ICD-10-CM

## 2014-05-10 DIAGNOSIS — G990 Autonomic neuropathy in diseases classified elsewhere: Secondary | ICD-10-CM

## 2014-05-10 DIAGNOSIS — K141 Geographic tongue: Secondary | ICD-10-CM

## 2014-05-10 LAB — POCT GLYCOSYLATED HEMOGLOBIN (HGB A1C): Hemoglobin A1C: 7.9

## 2014-05-10 LAB — GLUCOSE, POCT (MANUAL RESULT ENTRY): POC Glucose: 395 mg/dl — AB (ref 70–99)

## 2014-05-10 NOTE — Patient Instructions (Signed)
Follow up visit in 3 months. Please have lab tests done prior to next visit.

## 2014-05-10 NOTE — Progress Notes (Signed)
Subjective:  Patient Name: Victoria Atkinson Date of Birth: 1990-05-22  MRN: [id number]  Victoria Atkinson  presents to the office today for follow-up evaluation and management of her type 1 diabetes mellitus, goiter, hypoglycemia, peripheral neuropathy, autonomic neuropathy with inappropriate sinus tachycardia, headaches, Hashimoto's disease, geographic tongue, and hypertension.  HISTORY OF PRESENT ILLNESS:   Victoria Atkinson is a 23 y.o. Caucasian young woman.  Victoria Atkinson was unaccompanied.   1. The patient was first referred to me on 10/31/04 by Dr. Crista LuriaFrieda Menzer from [facility] for evaluation and management of new onset type 1 diabetes mellitus. The patient was 23 years old.   A. The patient had about a 2 month period of polyuria, polydipsia, nocturia, upset stomach, nausea, stomach pains, and a documented 17 pound weight loss. She was often dizzy and lightheaded. At her PCP's office a serum glucose was 357. A serum CO2 was 26. I arranged to see the patient that day. 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 Family history was positive for a cousin with type 1 diabetes mellitus, a maternal grandmother with a "sluggish" thyroid, and a maternal aunt who took thyroid medicine. On physical examination her height was 65-1/2 inches. She weighed 99-1/2 pounds. Her BMI was 16.4. Her CBG was high, which was greater than 500 on our meter. Her hemoglobin A1c was 12.6%.  B. We provided diabetes education to the patient and her family in our office. I started her on Novolog by sliding scale. I subsequently changed the Novolog to our two-component method with a correction dose and food dose at mealtimes. I also added Lantus insulin.  2. During the past nine years, the patient has done well overall. She had a good honeymoon period that lasted about 9 months, during which her hemoglobin A1c's varied from 6.0-6.3%. On 08/23/2005 we started her on insulin pump therapy with a Medtronic  Paradigm 722 insulin pump. Since then, her hemoglobin A1c values have varied from 6.5-8.3%.   3. On 10/21/2005, as part of an evaluation for headaches, an MRI was performed. A 3.1 x 6.2 x 11.0 cm posterior fossa arachnoid cyst was noted. [facility] felt that she had a mega cisterna magna rather than a cyst. This was considered to be normal variant and was not felt to be causing her headaches. She was told that surgery would not be required.  4. The patient's last [facility] visit was on 01/31/14. In the interim, she had been healthy until two weeks ago when she contracted a URI from her niece and nephew. She is almost back to normal now. She is no longer having much of a problem with her IBS symptoms or the swelling and pain in her hands and knees. She is also no longer having frequent headaches.   A. She is still following the gluten-free diet. Since going gluten-free she has gradually become better at counting carbs.   B. She has an [employer] CGM sensor that works well, but she only uses it intermittently.   C. She uses Novolog aspart in her pump. She still takes lisinopril, 5 mg/day.   5. Pertinent Review of Systems:  Constitutional: The patient feels "pretty good". She has been healthy.  Eyes: Vision is good with her glasses. Her last exam was last month in May of this year. She had no signs of diabetic eye disease. There are no significant eye complaints. Mouth: She frequently misses her  vitamins, so her geographic tongue is "really bad right now".   Neck: The patient has occasional complaints of anterior neck soreness. She thinks that her goiter has remained about the same size.  Heart: Heart rate increases with exercise or other physical activity. The patient has no complaints of palpitations, irregular heat beats, chest pain, or chest pressure. Gastrointestinal: She has no adverse GI symptoms when she adheres to  her gluten-free and lactose-free diet. The patient has no complaints of excessive hunger, acid reflux, upset stomach, stomach aches or other pains, diarrhea, or constipation. Legs: She has no pain and swelling of her left knee. Muscle mass and strength seem normal. There are no other complaints of numbness, tingling, burning, or pain. No edema is noted. Feet: There are no obvious foot problems. There are no other complaints of numbness, tingling, burning, or pain. No edema is noted. GYN: She remains on oral contraceptive pills. LMP was 1-2 weeks ago. Hypoglycemia: She is not having hypoglycemia very often. When she has low BGs they tend to occur when she is busy at work. None have been severe.    6. BG printout: She changes her sites every 1-6 days. She checks BGs 2-5 times per day. She boluses 2-4 times per day. BGs are quite variable. Her AM BGs tend to be around 190. BGs increase after lunch, then tend to decrease in the late afternoons and evenings. She has been having some low BGs about 11 AM and in the evenings when working. Most of her 300s-400s are associated with poor sites. When her sites are working, her BGs are much better, 74-160. She still misses many BG checks at bedtime. She occasionally intentionally does not take a bolus if she feels that she will be more physically active than usual. Her largest spread of BGs is about 3 PM.  Average BG for the past 4 weeks is 185, compared with 190 at the last visit and with 185 at the prior visit.     PAST MEDICAL, FAMILY, AND SOCIAL HISTORY:  Past Medical History  Diagnosis Date  . Type 1 diabetes mellitus not at goal   . Hypoglycemia associated with diabetes   . Goiter   . DM type 1 with diabetic peripheral neuropathy   . Hashimoto's disease   . Hypertension   . Orthostatic hypotension   . Brain cyst     Family History  Problem Relation Age of Onset  . Thyroid disease Maternal Aunt     Hypothyroid, takes thyroid medicine  . Thyroid  disease Maternal Grandmother     Sluggish thyroid  . Cancer Paternal Grandfather   . Diabetes Cousin     T1 DM    Current outpatient prescriptions: drospirenone-ethinyl estradiol (YAZ,GIANVI,LORYNA) 3-0.02 MG tablet, Take 1 tablet by mouth daily.  , Disp: , Rfl: ;  lisinopril (PRINIVIL,ZESTRIL) 5 MG tablet, TAKE ONE TABLET BY MOUTH ONE TIME DAILY, Disp: 30 tablet, Rfl: 9;  loratadine (CLARITIN) 10 MG tablet, Take 10 mg by mouth daily., Disp: , Rfl: ;  Norethin Ace-Eth Estrad-FE (LOMEDIA 24 FE PO), Take by mouth., Disp: , Rfl:  NOVOLOG 100 UNIT/ML injection, Use 300 units every 48 hours in insulin pump, Disp: 5 vial, Rfl: 6;  Bisacodyl (DULCOLAX PO), Take by mouth., Disp: , Rfl: ;  cetirizine (ZYRTEC) 10 MG tablet, Take 10 mg by mouth daily., Disp: , Rfl: ;  cetirizine-pseudoephedrine (ZYRTEC-D) 5-120 MG per tablet, Take 1 tablet by mouth 2 (two) times daily., Disp: , Rfl: ;  dicyclomine (  BENTYL) 20 MG tablet, Take 20 mg by mouth every 6 (six) hours., Disp: , Rfl:   Allergies as of 05/10/2014 - Review Complete 01/31/2014  Allergen Reaction Noted  . Zithromax [azithromycin] Hives 08/21/2010    1. Work and Family: She graduated from [employer] one year ago. She works part-time at [employer]  She wants to go to grad school for a PhD in microbiology.  2. Activities: Victoria Atkinson has not been working out very much. 3. Smoking, alcohol, or drugs: None 4. Primary Care Provider: Dr. Everlene Other at [facility]  REVIEW OF SYSTEMS: She is allergic to raspberries and poison ivy. There are no other significant problems involving Victoria Atkinson's other body systems.   Objective:  Vital Signs:  BP 111/76 mmHg  Pulse 108  Wt 174 lb (78.926 kg) .    Ht Readings from Last 3 Encounters:  12/13/12 5\' 7"  (1.702 m)  09/16/12 5' 7.09" (1.704 m)  03/11/07 5\' 8"  (1.727 m) (94 %*, Z = 1.56)   * Growth percentiles are based on CDC 2-20 Years data.   Wt Readings from Last 3 Encounters:   05/10/14 174 lb (78.926 kg)  01/31/14 177 lb (80.287 kg)  12/13/12 169 lb (76.658 kg)  Normalized stature-for-age data available only for age 74 to 20 years. Normalized weight-for-age data available only for age 74 to 20 years.  PHYSICAL EXAM:  Constitutional: The patient appears healthy and well nourished. She has lost 3 pounds since last visit. She still appears overweight/obese. Her Ideal Body Weight is 135 pounds. Face: The face appears normal.  Eyes: There is no obvious arcus or proptosis. Moisture appears normal. Mouth: The oropharynx is normal. Her tongue shows much more geographic tongue. Oral moisture is normal. Neck: The neck appears to be visibly enlarged. No carotid bruits are noted. The thyroid gland is again mildly  enlarged at 21-22 grams in size. The consistency of the thyroid gland is normal. The thyroid gland is not tender to palpation. Lungs: The lungs are clear to auscultation. Air movement is good. Heart: Heart rate and rhythm are regular. Heart sounds S1 and S2 are normal. I did not appreciate any pathologic cardiac murmurs. Abdomen: The abdomen is enlarged. Bowel sounds are normal. There is no obvious hepatomegaly, splenomegaly, or other mass effect.  Arms: Muscle size and bulk are normal for age. Hands: There is no obvious tremor. Phalangeal and metacarpophalangeal joints are normal. Palmar muscles are normal. Palmar skin is normal. Palmar moisture is also normal. Legs: Muscles appear normal for age. No edema is present. Feet: Feet are normally formed. Dorsalis pedal pulses are 1+ on the right and faint 1+ on the left.   Neurologic: Strength is normal for age in both the upper and lower extremities. Muscle tone is normal. Sensation to touch is normal in both the legs and in both feet.   LAB DATA: Hemoglobin A1c was 7.9% today, compared with 7.5% at last visit and with 6.9% at the visit prior.    Labs 01/25/14: HbA1c 8.4%; CMP normal except for glucose of 178;  cholesterol 225, triglycerides 276, HDL 77, LDL 92; microalbumin/creatinine ratio 2.6; TSH 2.597, free T4 1.06, free T3 2.9  Labs: 06/22/12: Cholesterol 236, triglycerides 300, HDL 68, LDL, 108 (pre-gluten-free diet); TTG IgA 6.4 (<20); IgA 152 (69-380); urinary microalbumin/creatinine ratio 3.1  Labs: 12/25/11: TSH 1.259, free T4 1.26, free T3 3.2; CMP normal          Assessment and Plan:   ASSESSMENT:  1. T1DM:  BGs are higher by HbA1c, but lower by the average BG value. This discrepancy indicates that she is sometimes having higher BGs than she recognizes. She is now more insulin-resistant and less insulin-sensitive. She is doing about the same in terms of BG variability. She has had 3 BGs > 400, compared to 4 at last visit. Most of her higher BGs occur when her sites go bad or when she fails to check BGs at bedtime and respond accordingly. She needs to change sites as soon as they begin to go bad.  2. Hypoglycemia: She is having some low BGs, mostly associated with being physically active at work or eating less.  3. Hypertension: Her systolic BP and diastolic BP are good today.   4. Goiter/thyroiditis/transient hypothyroidism: Her TFTs were mid-range euthyroid in August 2013. Her TFTs in September 2015 were in the lower quartile of the normal range. Her goiter is still enlarged at about the same size. The waxing and waning of thyroid gland size and the bouncing of TFTs are c/w evolving Hashimoto's disease.  5. Peripheral neuropathy: Although her BGs are higher, her neuropathy has improved. It is normal to have a lag period between BG changes and neurological findings.  6. Dizziness/orthostatic hypotension: Her symptoms have resolved.   7. Autonomic neuropathy and tachycardia: Her HR has increased, paralleling her increased HbA1c.  8. Thyroiditis: Clinically quiescent.  9. Diarrhea, bilious vomiting, and RUQ pain: Resolved on her gluten-free and lactose-free diet  11. Geographic tongue: It is  important for her to take her multivitamins consistently.Marland Kitchen.  PLAN: 1. Diagnostic: Will re-order CMP, TFTs, lipid panel, and the urine protein in 3 months..    2. Therapeutic: Use her sensor. Continue lisinopril at 5 mg/day. Check BGs at bedtime. Continue current basal rates: Midnight:  0.550 4 AM: 1.10 9 AM: 0.450 1 PM: 0.525 6 PM: 0.600 10 PM: 0.375  3. Patient education: We discussed issues of hyperglycemia, hypoglycemia, thyroiditis, weight gain, dehydration, and hypertension at length. 4. Follow-up: 3 months  Level of Service: This visit lasted in excess of 50 minutes. More than 50% of the visit was devoted to counseling.  [name] StallBRENNAN,MICHAEL J, MD 05/10/2014 3:01 PM

## 2014-05-31 ENCOUNTER — Other Ambulatory Visit: Payer: Self-pay | Admitting: *Deleted

## 2014-05-31 DIAGNOSIS — E1065 Type 1 diabetes mellitus with hyperglycemia: Secondary | ICD-10-CM

## 2014-05-31 DIAGNOSIS — IMO0002 Reserved for concepts with insufficient information to code with codable children: Secondary | ICD-10-CM

## 2014-07-18 ENCOUNTER — Other Ambulatory Visit: Payer: Self-pay | Admitting: *Deleted

## 2014-07-18 DIAGNOSIS — E1065 Type 1 diabetes mellitus with hyperglycemia: Secondary | ICD-10-CM

## 2014-07-18 DIAGNOSIS — IMO0002 Reserved for concepts with insufficient information to code with codable children: Secondary | ICD-10-CM

## 2014-08-05 LAB — COMPREHENSIVE METABOLIC PANEL
ALBUMIN: 4 g/dL (ref 3.5–5.2)
ALT: 10 U/L (ref 0–35)
AST: 13 U/L (ref 0–37)
Alkaline Phosphatase: 78 U/L (ref 39–117)
BUN: 12 mg/dL (ref 6–23)
CHLORIDE: 102 meq/L (ref 96–112)
CO2: 28 meq/L (ref 19–32)
Calcium: 9.2 mg/dL (ref 8.4–10.5)
Creat: 0.78 mg/dL (ref 0.50–1.10)
GLUCOSE: 206 mg/dL — AB (ref 70–99)
Potassium: 4.7 mEq/L (ref 3.5–5.3)
SODIUM: 138 meq/L (ref 135–145)
Total Bilirubin: 0.5 mg/dL (ref 0.2–1.2)
Total Protein: 6.8 g/dL (ref 6.0–8.3)

## 2014-08-05 LAB — T4, FREE: Free T4: 1.08 ng/dL (ref 0.80–1.80)

## 2014-08-05 LAB — LIPID PANEL
Cholesterol: 197 mg/dL (ref 0–200)
HDL: 58 mg/dL (ref >=46)
LDL CALC: 112 mg/dL — AB (ref 0–99)
Total CHOL/HDL Ratio: 3.4 Ratio
Triglycerides: 137 mg/dL (ref <150)
VLDL: 27 mg/dL (ref 0–40)

## 2014-08-05 LAB — T3, FREE: T3, Free: 3.1 pg/mL (ref 2.3–4.2)

## 2014-08-05 LAB — TSH: TSH: 2.937 u[IU]/mL (ref 0.350–4.500)

## 2014-08-05 LAB — MICROALBUMIN / CREATININE URINE RATIO
Creatinine, Urine: 214.8 mg/dL
Microalb Creat Ratio: 3.3 mg/g (ref 0.0–30.0)
Microalb, Ur: 0.7 mg/dL (ref <2.0)

## 2014-08-05 LAB — HEMOGLOBIN A1C
Hgb A1c MFr Bld: 7.5 % — ABNORMAL HIGH
Mean Plasma Glucose: 169 mg/dL — ABNORMAL HIGH

## 2014-08-09 ENCOUNTER — Encounter: Payer: Self-pay | Admitting: "Endocrinology

## 2014-08-09 ENCOUNTER — Ambulatory Visit (INDEPENDENT_AMBULATORY_CARE_PROVIDER_SITE_OTHER): Payer: PRIVATE HEALTH INSURANCE | Admitting: "Endocrinology

## 2014-08-09 VITALS — BP 113/76 | HR 78 | Wt 166.3 lb

## 2014-08-09 DIAGNOSIS — IMO0002 Reserved for concepts with insufficient information to code with codable children: Secondary | ICD-10-CM

## 2014-08-09 DIAGNOSIS — R000 Tachycardia, unspecified: Secondary | ICD-10-CM

## 2014-08-09 DIAGNOSIS — I471 Supraventricular tachycardia: Secondary | ICD-10-CM | POA: Diagnosis not present

## 2014-08-09 DIAGNOSIS — K141 Geographic tongue: Secondary | ICD-10-CM

## 2014-08-09 DIAGNOSIS — E10649 Type 1 diabetes mellitus with hypoglycemia without coma: Secondary | ICD-10-CM

## 2014-08-09 DIAGNOSIS — E1065 Type 1 diabetes mellitus with hyperglycemia: Secondary | ICD-10-CM | POA: Diagnosis not present

## 2014-08-09 DIAGNOSIS — E1043 Type 1 diabetes mellitus with diabetic autonomic (poly)neuropathy: Secondary | ICD-10-CM

## 2014-08-09 DIAGNOSIS — G990 Autonomic neuropathy in diseases classified elsewhere: Secondary | ICD-10-CM

## 2014-08-09 DIAGNOSIS — E049 Nontoxic goiter, unspecified: Secondary | ICD-10-CM

## 2014-08-09 DIAGNOSIS — E1042 Type 1 diabetes mellitus with diabetic polyneuropathy: Secondary | ICD-10-CM

## 2014-08-09 DIAGNOSIS — E063 Autoimmune thyroiditis: Secondary | ICD-10-CM

## 2014-08-09 LAB — GLUCOSE, POCT (MANUAL RESULT ENTRY): POC Glucose: 212 mg/dL — AB (ref 70–99)

## 2014-08-09 NOTE — Patient Instructions (Signed)
Follow up visit in 3 months. 

## 2014-08-09 NOTE — Progress Notes (Signed)
Subjective:  Patient Name: Victoria Atkinson Date of Birth: Sep 21, 1990  MRN: [id number]  Victoria Atkinson  presents to the office today for follow-up evaluation and management of her type 1 diabetes mellitus, goiter, hypoglycemia, peripheral neuropathy, autonomic neuropathy with inappropriate sinus tachycardia, headaches, Hashimoto's disease, geographic tongue, and hypertension.  HISTORY OF PRESENT ILLNESS:   Victoria Atkinson is a 24 y.o. Caucasian young woman.  Victoria Atkinson was unaccompanied.   1. The patient was first referred to me on 10/31/04 by Dr. Crista Luria from [facility] for evaluation and management of new onset type 1 diabetes mellitus. The patient was 24 years old.   A. The patient had about a 2 month period of polyuria, polydipsia, nocturia, upset stomach, nausea, stomach pains, and a documented 17 pound weight loss. She was often dizzy and lightheaded. At her PCP's office a serum glucose was 357. A serum CO2 was 26. I arranged to see the patient that day. 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 Family history was positive for a cousin with type 1 diabetes mellitus, a maternal grandmother with a "sluggish" thyroid, and a maternal aunt who took thyroid medicine. On physical examination her height was 65-1/2 inches. She weighed 99-1/2 pounds. Her BMI was 16.4. Her CBG was high, which was greater than 500 on our meter. Her hemoglobin A1c was 12.6%.  B. We provided diabetes education to the patient and her family in our office. I started her on Novolog by sliding scale. I subsequently changed the Novolog to our two-component method with a correction dose and food dose at mealtimes. I also added Lantus insulin.  2. During the past nine years, the patient has done well overall. She had a good honeymoon period that lasted about 9 months, during which her hemoglobin A1c's varied from 6.0-6.3%. On 08/23/2005 we started her on insulin pump therapy with a Medtronic  Paradigm 722 insulin pump. Since then, her hemoglobin A1c values have varied from 6.5-8.3%. She converted to a Medtronic 530G pump in the spring of 2015.   3. On 10/21/2005, as part of an evaluation for headaches, an MRI was performed. A 3.1 x 6.2 x 11.0 cm posterior fossa arachnoid cyst was noted. Subsequent evaluation by neurosurgeons at [facility] and at [facility] felt that she had a mega cisterna magna rather than a cyst. This was considered to be normal variant and was not felt to be causing her headaches. She was told that surgery would not be required.  4. The patient's last [facility] visit was on 05/10/14. In the interim, she had been healthy, but her allergies are acting up a little bit now.  She is no longer having much of a problem with her IBS symptoms or the swelling and pain in her hands and knees. She is also no longer having frequent headaches.   A. She is still following the gluten-free diet. Since going gluten-free she has gradually become better at counting carbs.   B. She often forgets to use her Enlite CGM sensor. When she uses the sensor it works well and her BGs are better.  C. She uses Novolog aspart in her pump. She still takes lisinopril, 5 mg/day.   5. Pertinent Review of Systems:  Constitutional: The patient feels "pretty good". She has been healthy.  Eyes: Vision is good with her glasses. Her last exam was in May 2015. She had no signs of diabetic eye disease. There are no significant eye complaints. Mouth:  She frequently misses her vitamins, so her geographic tongue is "fair".   Neck: The patient has occasional complaints of anterior neck soreness. She thinks that her goiter has remained about the same size.  Heart: Heart rate increases with exercise or other physical activity. The patient has no complaints of palpitations, irregular heat beats, chest pain, or chest pressure. Gastrointestinal: She has no adverse GI symptoms when she adheres to  her gluten-free and lactose-free diet. The patient has no complaints of excessive hunger, acid reflux, upset stomach, stomach aches or other pains, diarrhea, or constipation. Legs: She still wears a left knee brace when she exercises. Muscle mass and strength seem normal. There are no other complaints of numbness, tingling, burning, or pain. No edema is noted. Feet: There are no obvious foot problems. There are no other complaints of numbness, tingling, burning, or pain. No edema is noted. GYN: She remains on oral contraceptive pills. LMP was 3 weeks ago. Hypoglycemia: She has had some low BGs during or after exercise.  6. BG printout: She changes her sites every 3-7 days. She checks BGs 2-6 times per day. She boluses 3-4 times per day. Her average BG is 180, compared with 185 at her last visit and with 190 at the visit prior. BGs are quite variable, with a range of 46-358. BGs tend to increase the longer her sites remain in place. Most low BGs occur from noon to 10 PM.  Most of her 300s are associated with poor sites. When her sites are working, her BGs are much better, 70s-170s. She still misses some BG checks at bedtime. Her largest spread of BGs is about 8 PM.     PAST MEDICAL, FAMILY, AND SOCIAL HISTORY:  Past Medical History  Diagnosis Date  . Type 1 diabetes mellitus not at goal   . Hypoglycemia associated with diabetes   . Goiter   . DM type 1 with diabetic peripheral neuropathy   . Hashimoto's disease   . Hypertension   . Orthostatic hypotension   . Brain cyst     Family History  Problem Relation Age of Onset  . Thyroid disease Maternal Aunt     Hypothyroid, takes thyroid medicine  . Thyroid disease Maternal Grandmother     Sluggish thyroid  . Cancer Paternal Grandfather   . Diabetes Cousin     T1 DM     Current outpatient prescriptions:  .  dicyclomine (BENTYL) 20 MG tablet, Take 20 mg by mouth every 6 (six) hours., Disp: , Rfl:  .  lisinopril (PRINIVIL,ZESTRIL) 5 MG  tablet, TAKE ONE TABLET BY MOUTH ONE TIME DAILY, Disp: 30 tablet, Rfl: 9 .  loratadine (CLARITIN) 10 MG tablet, Take 10 mg by mouth daily., Disp: , Rfl:  .  Norethin Ace-Eth Estrad-FE (LOMEDIA 24 FE PO), Take by mouth., Disp: , Rfl:  .  NOVOLOG 100 UNIT/ML injection, Use 300 units every 48 hours in insulin pump, Disp: 5 vial, Rfl: 6 .  Bisacodyl (DULCOLAX PO), Take by mouth., Disp: , Rfl:   Allergies as of 08/09/2014 - Review Complete 08/09/2014  Allergen Reaction Noted  . Zithromax [azithromycin] Hives 08/21/2010    1. Work and Family: She graduated from [employer] one year ago. She works part-time at [employer].  She wants to go to grad school for a PhD in microbiology at some time in the future.  2. Activities: [name] has recently been working out more. 3. Smoking, alcohol, or drugs: None 4. Primary Care Provider: [facility]ka [facility]  REVIEW OF SYSTEMS: She is allergic to raspberries and poison ivy. There are no other significant problems involving Abeer's other body systems.   Objective:  Vital Signs:  BP 113/76 mmHg  Pulse 78  Wt 166 lb 4.8 oz (75.433 kg) .    Ht Readings from Last 3 Encounters:  12/13/12  (1.702 m)  09/16/12 5' 7.09" (1.704 m)  03/11/07  (1.727 m) (94 %*, Z = 1.56)   * Growth percentiles are based on CDC 2-20 Years data.   Wt Readings from Last 3 Encounters:  08/09/14 166 lb 4.8 oz (75.433 kg)  05/10/14 174 lb (78.926 kg)  01/31/14 177 lb (80.287 kg)  Normalized stature-for-age data available only for age 66 to 20 years. Normalized weight-for-age data available only for age 66 to 20 years.  PHYSICAL EXAM:  Constitutional: The patient appears healthy and well nourished. She has lost 8 pounds since last visit. She still appears overweight. Her Ideal Body Weight is 135 pounds. Face: The face appears normal.  Eyes: There is no obvious arcus or proptosis. Moisture appears normal. Mouth: The oropharynx is  normal. Her tongue appears normal today. Oral moisture is normal. Neck: The neck appears to be visibly enlarged. No carotid bruits are noted. The thyroid gland is mildly enlarged, but a bit smaller, at  21 grams in size. The consistency of the thyroid gland is normal. The thyroid gland is not tender to palpation. Lungs: The lungs are clear to auscultation. Air movement is good. Heart: Heart rate and rhythm are regular. Heart sounds S1 and S2 are normal. I did not appreciate any pathologic cardiac murmurs. Abdomen: The abdomen is enlarged. Bowel sounds are normal. There is no obvious hepatomegaly, splenomegaly, or other mass effect.  Arms: Muscle size and bulk are normal for age. Hands: There is no obvious tremor. Phalangeal and metacarpophalangeal joints are normal. Palmar muscles are normal. Palmar skin is normal. Palmar moisture is also normal. Legs: Muscles appear normal for age. No edema is present. Feet: Feet are normally formed. Dorsalis pedal pulses are 1+ on the right and faint 1+ on the left.   Neurologic: Strength is normal for age in both the upper and lower extremities. Muscle tone is normal. Sensation to touch is normal in both the legs and feet.   LAB DATA:   Labs 08/04/14: HbA1c 7.5%, CMP normal except for glucose of 201; urinary microalbumin/creatinine ratio 3.3; TSH 2.937, free T4 1.08, free T3 3.1; cholesterol 197, triglycerides 137, HDL 58, and LDL 112  Labs 1/61/09: Hemoglobin A1c was 7.9%, compared with 7.5% at last visit and with 6.9% at the visit prior.    Labs 01/25/14: HbA1c 8.4%; CMP normal except for glucose of 178; cholesterol 225, triglycerides 276, HDL 77, LDL 92; microalbumin/creatinine ratio 2.6; TSH 2.597, free T4 1.06, free T3 2.9  Labs: 06/22/12: Cholesterol 236, triglycerides 300, HDL 68, LDL, 108 (pre-gluten-free diet); TTG IgA 6.4 (<20); IgA 152 (69-380); urinary microalbumin/creatinine ratio 3.1  Labs: 12/25/11: TSH 1.259, free T4 1.26, free T3 3.2; CMP  normal          Assessment and Plan:   ASSESSMENT:  1. T1DM: BGs are lower by HbA1c and lower by the average BG value. She is not having any BGs.400 and is having more frequent BGs < 80. The biggest point of confusion in interpreting her BG data is the excess time she often allows sites to go before she changes them. We can't really adjust her  pump settings better until she gets the site problem under control  Using her CGM can be a big help. 2. Hypoglycemia: She is having some more BGs, mostly associated with being physically active at work or exercising more.  3. Hypertension: Her systolic BP and diastolic BP are good today.   4-6. Goiter/thyroiditis/transient hypothyroidism: Her TFTs were mid-range euthyroid in August 2013. Her TFTs in September 2015 were in the lower quartile of the normal range. Her TFTs today are very interesting. The fact that all three of her TFTs increased from September 2015 to March 2016 is pathognomonic for an interim flare up of Hashimoto's Dz. Her goiter is a bit smaller since last visit, indicating that the flare up was probably before the last visit. The waxing and waning of thyroid gland size and the bouncing of TFTs are c/w evolving Hashimoto's disease.  7. Peripheral neuropathy: The neuropathy is not evident today.  8. Dizziness/orthostatic hypotension: Her symptoms have resolved.   9-10. Autonomic neuropathy and tachycardia: Her autonomic neuropathy has improved and her HR has decreased, paralleling her decreased HbA1c.  11. Geographic tongue: Her tongue looks normal today. She needs to continue to take her MVI.   PLAN: 1. Diagnostic: Labs drawn on 08/04/14 were reviewed.  2. Therapeutic: Use her sensor. Continue lisinopril at 5 mg/day. Check BGs at bedtime. Continue exercising. Continue current basal rates: Midnight:  0.550 4 AM: 1.10 9 AM: 0.450 1 PM: 0.525 6 PM: 0.600 10 PM: 0.375  3. Patient education: We discussed issues of hyperglycemia, hypoglycemia,  thyroiditis, weight gain, dehydration, and hypertension at length. 4. Follow-up: 3 months  Level of Service: This visit lasted in excess of 50 minutes. More than 50% of the visit was devoted to counseling.  [name] Stall, MD 08/09/2014 2:30 PM

## 2014-10-14 ENCOUNTER — Other Ambulatory Visit: Payer: Self-pay | Admitting: "Endocrinology

## 2014-10-19 ENCOUNTER — Other Ambulatory Visit: Payer: Self-pay | Admitting: "Endocrinology

## 2014-11-01 ENCOUNTER — Other Ambulatory Visit: Payer: Self-pay | Admitting: "Endocrinology

## 2014-11-10 ENCOUNTER — Ambulatory Visit: Payer: PRIVATE HEALTH INSURANCE | Admitting: "Endocrinology

## 2014-11-16 ENCOUNTER — Ambulatory Visit (INDEPENDENT_AMBULATORY_CARE_PROVIDER_SITE_OTHER): Payer: No Typology Code available for payment source | Admitting: "Endocrinology

## 2014-11-16 ENCOUNTER — Encounter: Payer: Self-pay | Admitting: "Endocrinology

## 2014-11-16 VITALS — BP 121/80 | HR 102 | Wt 164.1 lb

## 2014-11-16 DIAGNOSIS — E1065 Type 1 diabetes mellitus with hyperglycemia: Secondary | ICD-10-CM

## 2014-11-16 DIAGNOSIS — R000 Tachycardia, unspecified: Secondary | ICD-10-CM

## 2014-11-16 DIAGNOSIS — I471 Supraventricular tachycardia: Secondary | ICD-10-CM | POA: Diagnosis not present

## 2014-11-16 DIAGNOSIS — K141 Geographic tongue: Secondary | ICD-10-CM

## 2014-11-16 DIAGNOSIS — E049 Nontoxic goiter, unspecified: Secondary | ICD-10-CM

## 2014-11-16 DIAGNOSIS — IMO0002 Reserved for concepts with insufficient information to code with codable children: Secondary | ICD-10-CM

## 2014-11-16 DIAGNOSIS — E10649 Type 1 diabetes mellitus with hypoglycemia without coma: Secondary | ICD-10-CM

## 2014-11-16 DIAGNOSIS — E1043 Type 1 diabetes mellitus with diabetic autonomic (poly)neuropathy: Secondary | ICD-10-CM | POA: Diagnosis not present

## 2014-11-16 DIAGNOSIS — I10 Essential (primary) hypertension: Secondary | ICD-10-CM

## 2014-11-16 DIAGNOSIS — E063 Autoimmune thyroiditis: Secondary | ICD-10-CM

## 2014-11-16 LAB — GLUCOSE, POCT (MANUAL RESULT ENTRY): POC Glucose: 130 mg/dl — AB (ref 70–99)

## 2014-11-16 LAB — POCT GLYCOSYLATED HEMOGLOBIN (HGB A1C): Hemoglobin A1C: 7.1

## 2014-11-16 NOTE — Patient Instructions (Signed)
Follow up visit in 3 months. Please call Dr. 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 Please repeat lab tests one week prior to next visit.

## 2014-11-16 NOTE — Progress Notes (Signed)
Subjective:  Patient Name: Victoria Atkinson Date of Birth: 11-13-1990  MRN: [id number]  Victoria Atkinson  presents to the office today for follow-up evaluation and management of her type 1 diabetes mellitus, goiter, hypoglycemia, peripheral neuropathy, autonomic neuropathy with inappropriate sinus tachycardia, headaches, Hashimoto's disease, geographic tongue, and hypertension.  HISTORY OF PRESENT ILLNESS:   Victoria Atkinson is a 24 y.o. Caucasian young woman.  Victoria Atkinson was unaccompanied.   1. The patient was first referred to me on 10/31/04 by Dr. Crista LuriaFrieda Menzer from [facility] for evaluation and management of new onset type 1 diabetes mellitus. The patient was 24 years old.  A. The patient had about a 2 month period of polyuria, polydipsia, nocturia, upset stomach, nausea, stomach pains, and a documented 17 pound weight loss. She was often dizzy and lightheaded. At her PCP's office a serum glucose was 357. A serum CO2 was 26. I arranged to see the patient that day. 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 Family history was positive for a cousin with type 1 diabetes mellitus, a maternal grandmother with a "sluggish" thyroid, and a maternal aunt who took thyroid medicine. On physical examination her height was 65-1/2 inches. She weighed 99-1/2 pounds. Her BMI was 16.4. Her CBG was high, which was greater than 500 on our meter. Her hemoglobin A1c was 12.6%.  B. We provided diabetes education to the patient and her family in our office. I started her on Novolog by sliding scale. I subsequently changed the Novolog to our two-component method with a correction dose and food dose at mealtimes. I also added Lantus insulin.  2. During the past ten years, the patient has done well overall. She had a good honeymoon period that lasted about 9 months, during which her hemoglobin A1c's varied from 6.0-6.3%. On 08/23/2005 we started her on insulin pump therapy with a Medtronic Paradigm  722 insulin pump. Since then, her hemoglobin A1c values have varied from 6.5-8.3%. She converted to a Medtronic 530G pump in the spring of 2015.   3. On 10/21/2005, as part of an evaluation for headaches, an MRI was performed. A 3.1 x 6.2 x 11.0 cm posterior fossa arachnoid cyst was noted. [facility] felt that she had a mega cisterna magna rather than a cyst. This was considered to be normal variant and was not felt to be causing her headaches. She was told that surgery would not be required.  4. The patient's last [facility] visit was on 08/09/14. In the interim, she had been healthy, but had allergic bronchitis about two months ago. She required a 5-day course of treatment with oral steroids, which caused her BGs to increase. She has not had many IBS symptoms. The swelling and pain in her hands and knees has resolved. She no longer has frequent headaches.   A. She is still following the gluten-free diet. Since going gluten-free she has gradually become better at counting carbs.   B. She has not often used her Enlite CGM sensor because she "forgets it". When she uses the sensor it works well and her BGs are better.  C. She uses Novolog aspart in her pump. She still takes lisinopril, 5 mg/day.   5. Pertinent Review of Systems:  Constitutional: The patient feels "pretty good". She has been healthy.  Eyes: Vision is good with her glasses. Her last exam was in about May 2016. She had no signs of  diabetic eye disease. There are no significant eye complaints. Mouth: She frequently misses her vitamins, so her geographic tongue is "not terrible".   Neck: The patient has not had any recent complaints of anterior neck soreness. She thinks that her goiter has remained about the same size.  Heart: Heart rate increases with exercise or other physical activity. The patient has no complaints of palpitations, irregular heat beats,  chest pain, or chest pressure. Gastrointestinal: She has no adverse GI symptoms when she adheres to her gluten-free and lactose-free diet. The patient has no complaints of excessive hunger, acid reflux, upset stomach, stomach aches or other pains, diarrhea, or constipation. Legs: She still occasionally wears a left knee brace when she exercises. Muscle mass and strength seem normal. There are no other complaints of numbness, tingling, burning, or pain. No edema is noted. Feet: She has an ingrown toenail of the left great toe and will have surgery soon. There are no other obvious foot problems. There are no other complaints of numbness, tingling, burning, or pain. No edema is noted. GYN: She remains on oral contraceptive pills. She is having her menstrual period now.  Hypoglycemia: She has had some profound low BGs in the mid-to-late afternoons when she is active at work and may not eat lunch on time.     6. BG printout: She changes her sites every 3-7 days. She checks BGs 2-6 times per day. She boluses 2-4 times per day. Her average BG is 184, compared with 180 at last visit and with 185 at the visit prior. BGs are quite variable, with a range of 46 to > 400, compared with 46-358 at last visit. BGs tend to increase the longer her sites remain in place. Most low BGs occur from 10 AM to 10 PM.  Most of her BGs >300 are associated with poor sites. When her sites are working, her BGs are much better, 70s-190s. She still misses some BG checks at bedtime. Her largest spread of BGs is about noontime.     PAST MEDICAL, FAMILY, AND SOCIAL HISTORY:  Past Medical History  Diagnosis Date  . Type 1 diabetes mellitus not at goal   . Hypoglycemia associated with diabetes   . Goiter   . DM type 1 with diabetic peripheral neuropathy   . Hashimoto's disease   . Hypertension   . Orthostatic hypotension   . Brain cyst     Family History  Problem Relation Age of Onset  . Thyroid disease Maternal Aunt      Hypothyroid, takes thyroid medicine  . Thyroid disease Maternal Grandmother     Sluggish thyroid  . Cancer Paternal Grandfather   . Diabetes Cousin     T1 DM     Current outpatient prescriptions:  .  lisinopril (PRINIVIL,ZESTRIL) 5 MG tablet, TAKE ONE TABLET BY MOUTH ONE TIME DAILY, Disp: 30 tablet, Rfl: 9 .  loratadine (CLARITIN) 10 MG tablet, Take 10 mg by mouth daily., Disp: , Rfl:  .  Norethin Ace-Eth Estrad-FE (LOMEDIA 24 FE PO), Take by mouth., Disp: , Rfl:  .  NOVOLOG 100 UNIT/ML injection, USE 300 UNITS EVERY 48 HOURS IN INSULIN PUMP, Disp: 50 mL, Rfl: 4 .  Bisacodyl (DULCOLAX PO), Take by mouth., Disp: , Rfl:  .  dicyclomine (BENTYL) 20 MG tablet, Take 20 mg by mouth every 6 (six) hours., Disp: , Rfl:   Allergies as of 11/16/2014 - Review Complete 11/16/2014  Allergen Reaction Noted  . Zithromax [azithromycin] Hives 08/21/2010  1. Work and Family: She graduated from [employer] in 2014. She will start a new job soon, working third shift (10 PM to 6 AM), at a [employer]. She wants to go to grad school for a PhD in microbiology at some time in the future.  2. Activities: Ernestina has not been working out much. 3. Smoking, alcohol, or drugs: None 4. Primary Care Provider: Dr. Everlene Other at [facility]  REVIEW OF SYSTEMS: She is allergic to raspberries and poison ivy. There are no other significant problems involving Elanore's other body systems.   Objective:  Vital Signs:  BP 121/80 mmHg  Pulse 102  Wt 164 lb 1.6 oz (74.435 kg) .    Ht Readings from Last 3 Encounters:  12/13/12 5\' 7"  (1.702 m)  09/16/12 5' 7.09" (1.704 m)  03/11/07 5\' 8"  (1.727 m) (94 %*, Z = 1.56)   * Growth percentiles are based on CDC 2-20 Years data.   Wt Readings from Last 3 Encounters:  11/16/14 164 lb 1.6 oz (74.435 kg)  08/09/14 166 lb 4.8 oz (75.433 kg)  05/10/14 174 lb (78.926 kg)  Normalized stature-for-age data available only for age 84 to 20  years. Normalized weight-for-age data available only for age 84 to 20 years.  PHYSICAL EXAM:  Constitutional: The patient appears healthy and well nourished. She has lost 2 pounds since last visit. She still appears overweight. Her Ideal Body Weight is 135 pounds. She is very bright, personable, and mature Face: The face appears normal.  Eyes: There is no obvious arcus or proptosis. Moisture appears normal. Mouth: The oropharynx is normal. Her tongue appears normal today. Oral moisture is normal. Neck: The neck appears to be visibly enlarged. No carotid bruits are noted. The thyroid gland is mildly enlarged and larger at 22-23 grams in size. The left lobe is enlarged. The right lobe is at the upper limit of normal size. The consistency of the thyroid gland is normal. The thyroid gland is not tender to palpation. Lungs: The lungs are clear to auscultation. Air movement is good. Heart: Heart rate and rhythm are regular. Heart sounds S1 and S2 are normal. I did not appreciate any pathologic cardiac murmurs. Abdomen: The abdomen is enlarged. Bowel sounds are normal. There is no obvious hepatomegaly, splenomegaly, or other mass effect.  Arms: Muscle size and bulk are normal for age. Hands: There is no obvious tremor. Phalangeal and metacarpophalangeal joints are normal. Palmar muscles are normal. Palmar skin is normal. Palmar moisture is also normal. Legs: Muscles appear normal for age. No edema is present. Feet: Feet are normally formed. Dorsalis pedal pulses are faint 1+ bilaterally. PT pulses are 1+ bilaterally.   Neurologic: Strength is normal for age in both the upper and lower extremities. Muscle tone is normal. Sensation to touch is normal in both the legs and feet.   LAB DATA:   Labs 11/16/14: HbA1c 7.1%  Labs 08/04/14: HbA1c 7.5%, CMP normal except for glucose of 201; urinary microalbumin/creatinine ratio 3.3; TSH 2.937, free T4 1.08, free T3 3.1; cholesterol 197, triglycerides 137, HDL 58,  and LDL 112  Labs 01/31/14: Hemoglobin A1c was 7.9%, compared with 7.5% at last visit and with 6.9% at the visit prior.    Labs 01/25/14: HbA1c 8.4%; CMP normal except for glucose of 178; cholesterol 225, triglycerides 276, HDL 77, LDL 92; microalbumin/creatinine ratio 2.6; TSH 2.597, free T4 1.06, free T3 2.9  Labs: 06/22/12: Cholesterol 236, triglycerides 300, HDL 68, LDL, 108 (pre-gluten-free  diet); TTG IgA 6.4 (<20); IgA 152 (69-380); urinary microalbumin/creatinine ratio 3.1  Labs: 12/25/11: TSH 1.259, free T4 1.26, free T3 3.2; CMP normal          Assessment and Plan:   ASSESSMENT:  1. T1DM:   A. BGs are lower by HbA1c, but slightly higher according to her average BG values. This discrepancy indicates that she is sometime having lower BGs than she realizes. Her A1c is lower, in part, due to having too many low BGs. The biggest cause of her higher BGs is the excess time she often allows sites to go before she changes them. Her low BGs have been job-related.   B. Now that she will be changing jobs, we will need to identify new BG patterns so that we can develop new basal rate patterns.  2. Hypoglycemia: She is having more low BGs, mostly associated with being physically active at work.  3. Hypertension: Her systolic BP is good, but her diastolic BP is higher. She needs to walk daily. 4-6. Goiter/thyroiditis/transient hypothyroidism:   A. Her thyroid gland is a bit larger today. The pattern of waxing and waning of thyroid gland size is c/w evolving Hashimoto's Dz.   B. Her TFTs in March were at the low end of the normal range.  Her TFTs were mid-range euthyroid in August 2013. Her TFTs in September 2015 were in the lower quartile of the normal range. The fact that all three of her TFTs increased from September 2015 to March 2016 is pathognomonic for an interim flare up of Hashimoto's Dz.  7. Peripheral neuropathy: The neuropathy is not evident today.  8. Dizziness/orthostatic hypotension: Her  symptoms have resolved.   9-10. Autonomic neuropathy and tachycardia: Her autonomic neuropathy has\d improved, but her heart rate is actually higher today. The increase in HR may be due to her having more BGs > 300 in the past month.  11. Geographic tongue: Her tongue looks normal today. She needs to continue to take her MVI.   PLAN: 1. Diagnostic: TFTs prior to next visit. Call me on a Wednesday or Sunday evening one week after starting her new third shift job.  2. Therapeutic: Use her sensor. Continue lisinopril at 5 mg/day. Check BGs at bedtime. Resume exercising. Continue current basal rates for now, but consider reducing her 4 AM basal rate to 0.6 units per hour once she starts her new job.  Midnight:  0.550 4 AM: 1.10 9 AM: 0.450 1 PM: 0.525 6 PM: 0.600 10 PM: 0.375  3. Patient education: We discussed issues of hyperglycemia, hypoglycemia, thyroiditis, weight gain, dehydration, and hypertension at length. 4. Follow-up: 3 months  Level of Service: This visit lasted in excess of 55 minutes. More than 50% of the visit was devoted to counseling.  [name] Stall, MD 11/16/2014 11:33 AM

## 2015-01-23 ENCOUNTER — Encounter: Payer: Self-pay | Admitting: *Deleted

## 2015-02-21 ENCOUNTER — Ambulatory Visit (INDEPENDENT_AMBULATORY_CARE_PROVIDER_SITE_OTHER): Payer: 59 | Admitting: "Endocrinology

## 2015-02-21 ENCOUNTER — Encounter: Payer: Self-pay | Admitting: "Endocrinology

## 2015-02-21 VITALS — BP 114/75 | HR 105 | Wt 162.0 lb

## 2015-02-21 DIAGNOSIS — R000 Tachycardia, unspecified: Secondary | ICD-10-CM

## 2015-02-21 DIAGNOSIS — E1042 Type 1 diabetes mellitus with diabetic polyneuropathy: Secondary | ICD-10-CM

## 2015-02-21 DIAGNOSIS — E063 Autoimmune thyroiditis: Secondary | ICD-10-CM

## 2015-02-21 DIAGNOSIS — E109 Type 1 diabetes mellitus without complications: Secondary | ICD-10-CM | POA: Diagnosis not present

## 2015-02-21 DIAGNOSIS — E10649 Type 1 diabetes mellitus with hypoglycemia without coma: Secondary | ICD-10-CM

## 2015-02-21 DIAGNOSIS — I10 Essential (primary) hypertension: Secondary | ICD-10-CM

## 2015-02-21 DIAGNOSIS — E049 Nontoxic goiter, unspecified: Secondary | ICD-10-CM

## 2015-02-21 DIAGNOSIS — K141 Geographic tongue: Secondary | ICD-10-CM

## 2015-02-21 DIAGNOSIS — E1043 Type 1 diabetes mellitus with diabetic autonomic (poly)neuropathy: Secondary | ICD-10-CM | POA: Diagnosis not present

## 2015-02-21 LAB — POCT GLYCOSYLATED HEMOGLOBIN (HGB A1C): Hemoglobin A1C: 7.6

## 2015-02-21 LAB — GLUCOSE, POCT (MANUAL RESULT ENTRY): POC Glucose: 232 mg/dl — AB (ref 70–99)

## 2015-02-21 NOTE — Patient Instructions (Signed)
Follow up visit in 3 months. Please call Dr. Fransico Cage Gupton on 03/05/15 between 8:00-9:30 PM.

## 2015-02-21 NOTE — Progress Notes (Signed)
Subjective:  Patient Name: Victoria Atkinson Date of Birth: July 21, 1990  MRN: [id number]  Victoria Atkinson  presents to the office today for follow-up evaluation and management of her type 1 diabetes mellitus, goiter, hypoglycemia, peripheral neuropathy, autonomic neuropathy with inappropriate sinus tachycardia, headaches, Hashimoto's disease, geographic tongue, and hypertension.  HISTORY OF PRESENT ILLNESS:   Victoria Atkinson is a 24 y.o. Caucasian young woman.  Victoria Atkinson was unaccompanied.   1. The patient was first referred to me on 10/31/04 by Dr. Crista Luria from [facility] for evaluation and management of new onset type 1 diabetes mellitus. The patient was 24 years old.  A. The patient had about a 2 month period of polyuria, polydipsia, nocturia, upset stomach, nausea, stomach pains, and a documented 17 pound weight loss. She was often dizzy and lightheaded. At her PCP's office a serum glucose was 357. A serum CO2 was 26. I arranged to see the patient that day. 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 Family history was positive for a cousin with type 1 diabetes mellitus, a maternal grandmother with a "sluggish" thyroid, and a maternal aunt who took thyroid medicine. On physical examination her height was 65-1/2 inches. She weighed 99-1/2 pounds. Her BMI was 16.4. Her CBG was high, which was greater than 500 on our meter. Her hemoglobin A1c was 12.6%.  B. We provided diabetes education to the patient and her family in our office. I started her on Novolog by sliding scale. I subsequently changed the Novolog to our two-component method with a correction dose and food dose at mealtimes. I also added Lantus insulin.  2. During the past ten years, the patient has done well overall. She had a good honeymoon period that lasted about 9 months, during which her hemoglobin A1c's varied from 6.0-6.3%. On 08/23/2005 we started her on insulin pump therapy with a Medtronic Paradigm  722 insulin pump. Since then, her hemoglobin A1c values have varied from 6.5-8.3%. She converted to a Medtronic 530G pump in the spring of 2015.   3. On 10/21/2005, as part of an evaluation for headaches, an MRI was performed. A 3.1 x 6.2 x 11.0 cm posterior fossa arachnoid cyst was noted. Subsequent evaluation by neurosurgeons at [facility] and at [facility] felt that she had a mega cisterna magna rather than a cyst. This was considered to be normal variant and was not felt to be causing her headaches. She was told that surgery would not be required.  4. The patient's last [facility] visit was on 11/16/14. In the interim, she had been healthy. She has not had many IBS symptoms. The swelling and pain in her hands and knees has resolved. She no longer has frequent headaches.   A. She is still following the gluten-free diet. Since going gluten-free she has gradually become better at counting carbs. She does not drink milk, but does have cheese and ice cream.   B. She has not often used her Enlite CGM sensor because she has a lot of slowly healing pump site inflammation where the tape on her Victoria Atkinson impacts her. When she uses the sensor it works well and her BGs are better.  C. She uses Novolog aspart in her Medtronic 530G pump. She still takes lisinopril, 5 mg/day. She is not taking her MVI.  5. Pertinent Review of Systems:  Constitutional: The patient feels "pretty well". She has been healthy.  Eyes: Vision is good with her glasses. Her last exam  was in about May 2016. She had no signs of diabetic eye disease. There are no significant eye complaints. Mouth: She frequently misses her vitamins, so she still has her geographic tongue..   Neck: The patient has not had any recent complaints of anterior neck soreness. She thinks that her goiter has remained about the same size.  Heart: Heart rate increases with exercise or other physical activity. The patient has no complaints of  palpitations, irregular heat beats, chest pain, or chest pressure. Gastrointestinal: She has occasional acid reflux, but no other adverse GI symptoms when she adheres to her gluten-free and lactose-free diet. The patient has no complaints of excessive hunger, acid reflux, upset stomach, stomach aches or other pains, diarrhea, or constipation. Legs: Muscle mass and strength seem normal. There are no other complaints of numbness, tingling, burning, or pain. No edema is noted. Feet: She had surgery on her left ingrown toenails in late June or early July. There are no other obvious foot problems. There are no other complaints of numbness, tingling, burning, or pain. No edema is noted. GYN: She remains on oral contraceptive pills. Her LMP was 2-3 weeks ago.   Hypoglycemia: She has had some low BGs in the late evenings if she takes too much insulin at dinner.      6. BG printout: She changes her sites every 4-6 days. She checks BGs 3-6 times per day. She boluses 2-5 times per day. Her average BG is 211, compared with 184 at last visit and with 180 at the visit prior. BGs are quite variable, with a range of 51- >400, compared with 46 to > 400 at last visit and with 46-358 at the prior visit. BGs tend to increase the longer her sites remain in place. Many of her higher BGs occur when there is a big gap in time from when she goes to sleep in the mornings until she gets up in the evenings. She does not usually eat from 4 AM until about 4-5 PM. Most low BGs occur from 8-11 AM while she is sleeping and from 6 PM to 2 AM.    PAST MEDICAL, FAMILY, AND SOCIAL HISTORY:  Past Medical History  Diagnosis Date  . Type 1 diabetes mellitus not at goal [facility])   . Hypoglycemia associated with diabetes (HCC)   . Goiter   . DM type 1 with diabetic peripheral neuropathy (HCC)   . Hashimoto's disease   . Hypertension   . Orthostatic hypotension   . Brain cyst     Family History  Problem Relation Age of Onset  . Thyroid  disease Maternal Aunt     Hypothyroid, takes thyroid medicine  . Thyroid disease Maternal Grandmother     Sluggish thyroid  . Cancer Paternal Grandfather   . Diabetes Cousin     T1 DM     Current outpatient prescriptions:  .  dicyclomine (BENTYL) 20 MG tablet, Take 20 mg by mouth every 6 (six) hours., Disp: , Rfl:  .  lisinopril (PRINIVIL,ZESTRIL) 5 MG tablet, TAKE ONE TABLET BY MOUTH ONE TIME DAILY, Disp: 30 tablet, Rfl: 9 .  loratadine (CLARITIN) 10 MG tablet, Take 10 mg by mouth daily., Disp: , Rfl:  .  Norethin Ace-Eth Estrad-FE (LOMEDIA 24 FE PO), Take by mouth., Disp: , Rfl:  .  NOVOLOG 100 UNIT/ML injection, USE 300 UNITS EVERY 48 HOURS IN INSULIN PUMP, Disp: 50 mL, Rfl: 4 .  Bisacodyl (DULCOLAX PO), Take by mouth., Disp: , Rfl:   Allergies  as of 02/21/2015 - Review Complete 02/21/2015  Allergen Reaction Noted  . Zithromax [azithromycin] Hives 08/21/2010    1. Work and Family: She graduated from [employer] in 2014. She is working third shift (10 PM to 6 AM), at a [employer]. She wants to go to grad school for a PhD in microbiology at some time in the future.  2. Activities: Victoria Atkinson has not been working out much. 3. Smoking, alcohol, or drugs: None 4. Primary Care Provider: Dr. Everlene Other at [facility]  REVIEW OF SYSTEMS: She is allergic to raspberries and poison ivy. There are no other significant problems involving Victoria Atkinson's other body systems.   Objective:  Vital Signs:  BP 114/75 mmHg  Pulse 105  Wt 162 lb (73.483 kg) .    Ht Readings from Last 3 Encounters:  12/13/12 5\' 7"  (1.702 m)  09/16/12 5' 7.09" (1.704 m)  03/11/07 5\' 8"  (1.727 m) (94 %*, Z = 1.56)   * Growth percentiles are based on CDC 2-20 Years data.   Wt Readings from Last 3 Encounters:  02/21/15 162 lb (73.483 kg)  11/16/14 164 lb 1.6 oz (74.435 kg)  08/09/14 166 lb 4.8 oz (75.433 kg)  Normalized stature-for-age data available only for age 25 to 20  years. Normalized weight-for-age data available only for age 25 to 20 years.  PHYSICAL EXAM:  Constitutional: The patient appears healthy and well nourished. She has lost 2 pounds since last visit. She still appears overweight. Her Ideal Body Weight is 135 pounds. She is very bright, personable, and mature. Face: The face appears normal.  Eyes: There is no obvious arcus or proptosis. Moisture appears normal. Mouth: The oropharynx is normal. Her tongue is geographic in the proximal half. Oral moisture is normal. Neck: The neck appears to be visibly enlarged. No carotid bruits are noted. The thyroid gland is again mildly enlarged at 22-23 grams in size. The left lobe is mildly enlarged today, but the right lobe is larger. The consistency of the thyroid gland is normal. The thyroid gland is not tender to palpation. Lungs: The lungs are clear to auscultation. Air movement is good. Heart: Heart rate and rhythm are regular. Heart sounds S1 and S2 are normal. I did not appreciate any pathologic cardiac murmurs. Abdomen: The abdomen is enlarged. Bowel sounds are normal. There is no obvious hepatomegaly, splenomegaly, or other mass effect.  Arms: Muscle size and bulk are normal for age. Hands: There is no obvious tremor. Phalangeal and metacarpophalangeal joints are normal. Palmar muscles are normal. Palmar skin is normal. Palmar moisture is also normal. Legs: Muscles appear normal for age. No edema is present. Feet: Feet are normally formed. Dorsalis pedal pulses are faint 1+ bilaterally. PT pulses are faint 1+ bilaterally.   Neurologic: Strength is normal for age in both the upper and lower extremities. Muscle tone is normal. Sensation to touch is normal in both the legs and feet.   LAB DATA:   Labs 02/21/15: HbA1c 7.6%.   Labs 11/16/14: HbA1c 7.1%  Labs 08/04/14: HbA1c 7.5%, CMP normal except for glucose of 201; urinary microalbumin/creatinine ratio 3.3; TSH 2.937, free T4 1.08, free T3 3.1;  cholesterol 197, triglycerides 137, HDL 58, and LDL 112  Labs 1/61/09: Hemoglobin A1c was 7.9%, compared with 7.5% at last visit and with 6.9% at the visit prior.    Labs 01/25/14: HbA1c 8.4%; CMP normal except for glucose of 178; cholesterol 225, triglycerides 276, HDL 77, LDL 92; microalbumin/creatinine ratio 2.6; TSH  2.597, free T4 1.06, free T3 2.9  Labs: 06/22/12: Cholesterol 236, triglycerides 300, HDL 68, LDL, 108 (pre-gluten-free diet); TTG IgA 6.4 (<20); IgA 152 (69-380); urinary microalbumin/creatinine ratio 3.1  Labs: 12/25/11: TSH 1.259, free T4 1.26, free T3 3.2; CMP normal          Assessment and Plan:   ASSESSMENT:  1. T1DM:  BGs are higher by HbA1c and by her average BG values. The biggest cause of her higher BGs is the rise of BG from about 8 AM to 4-5 PM when she is sleeping. When she only  boluses twice a day, at about 3 AM and about 6 PM, she misses the opportunities to do the usual mealtime boluses. Since the amount of bolus insulin is less with her new job schedule than it used to be, we need to increase her basal insulin. Unfortunately, we run the risk of causing more hypoglycemia.    2. Hypoglycemia: She has had 10 BGs in the 40s and 50s.  7 /10 occurred later in the evenings. 3/10 occurred about 8 AM, 2/3 occurring when she worked late or after she had just come home.. She needs to check her BG either before she leaves work or soon after coming home.  3. Hypertension: Her BPs are pretty good today. Her diastolic BP could be lower if she exercises. She needs to walk daily. 4-6. Goiter/thyroiditis/transient hypothyroidism:   A. Her thyroid gland is about the same size, but the right lobe is larger and the left lobe is smaller. The pattern of waxing and waning of thyroid gland size is c/w evolving Hashimoto's Dz.   B. Her TFTs in March were at the low end of the normal range.  The fact that all three of her TFTs increased from September 2015 to March 2016 is pathognomonic for an  interim flare up of Hashimoto's Dz. She needs to repeat her TFTs now.  7. Peripheral neuropathy: The neuropathy is not evident today.  8. Dizziness/orthostatic hypotension: Her symptoms have resolved.   9-10. Autonomic neuropathy and tachycardia: Her autonomic neuropathy has not improved, so her heart rate is actually a bit higher.   11. Geographic tongue: Her tongue again shows the typical map-like features of vitamin deficiency. She needs to take her MVI.   PLAN: 1. Diagnostic: TFTs this week. Call me on Sunday evening 03/05/15 between 8:00-9:30 PM. 2. Therapeutic: Use her sensor. Continue lisinopril at 5 mg/day. Check BGs at bedtime. Resume exercising. New basal rates:   Midnight:  0.550 -> 0.60 4 AM: 1.10 9 AM: 0.450 1 PM: 0.525 -> 11 AM: 0.600 6 PM: 0.600 -> 0.550 10 PM: 0.375 -> 0.325 3. Patient education: We discussed issues of hyperglycemia, hypoglycemia, thyroiditis, weight gain, dehydration, and hypertension at length. 4. Follow-up: 3 months  Level of Service: This visit lasted in excess of 55 minutes. More than 50% of the visit was devoted to counseling.  [name] Stall, MD 02/21/2015 3:30 PM

## 2015-02-22 DIAGNOSIS — R000 Tachycardia, unspecified: Secondary | ICD-10-CM | POA: Insufficient documentation

## 2015-03-05 ENCOUNTER — Telehealth: Payer: Self-pay | Admitting: "Endocrinology

## 2015-03-05 NOTE — Telephone Encounter (Signed)
Received telephone call from [place]. 1. Overall status: Things are going pretty well. She thinks that she has had a few more low BGs at work between 11 PM and 1 AM, but fewer low BGs between 6 PM and midnight. 2. New problems: She is having more lows BG or she just may be catching the lows better since she is now wearing her sensor.  3. Last site change: About two days ago 4. Rapid-acting insulin: Novolog 5. BG log: 2 AM, Breakfast, Lunch, Supper, Bedtime 03/03/15: 86, 93, xxx, 221/337, xxx 03/04/15: 62/177, 112/137/95, xxx, 195, xxx 03/05/15: 130, xxx, 141/167, 179, pending 6. Assessment: She needs less insulin from 10 PM to 4 AM. 7. Plan: New basal rates: MN: 0.60 -> 0.55 4 AM: 1.10 9 AM: 0.45 11 AM: 0.60 6 PM: 0.55 10 PM: 0.325 -> 0.275 8. FU call: Call in two weeks [name] StallBRENNAN,MICHAEL J

## 2015-04-17 ENCOUNTER — Other Ambulatory Visit: Payer: Self-pay | Admitting: *Deleted

## 2015-04-17 DIAGNOSIS — E109 Type 1 diabetes mellitus without complications: Secondary | ICD-10-CM

## 2015-04-17 MED ORDER — INSULIN ASPART 100 UNIT/ML ~~LOC~~ SOLN: SUBCUTANEOUS | Status: DC

## 2015-04-17 MED ORDER — LISINOPRIL 5 MG PO TABS: 5.0000 mg | ORAL_TABLET | Freq: Every day | ORAL | Status: DC

## 2015-05-11 ENCOUNTER — Other Ambulatory Visit: Payer: Self-pay | Admitting: *Deleted

## 2015-05-11 ENCOUNTER — Telehealth: Payer: Self-pay | Admitting: "Endocrinology

## 2015-05-11 DIAGNOSIS — IMO0001 Reserved for inherently not codable concepts without codable children: Secondary | ICD-10-CM

## 2015-05-11 DIAGNOSIS — E1065 Type 1 diabetes mellitus with hyperglycemia: Principal | ICD-10-CM

## 2015-05-11 MED ORDER — INSULIN ASPART 100 UNIT/ML ~~LOC~~ SOLN: SUBCUTANEOUS | Status: DC

## 2015-05-11 MED ORDER — LISINOPRIL 5 MG PO TABS: 5.0000 mg | ORAL_TABLET | Freq: Every day | ORAL | Status: DC

## 2015-05-11 NOTE — Telephone Encounter (Signed)
Done, sent Rx as requested. LI

## 2015-06-12 ENCOUNTER — Encounter: Payer: Self-pay | Admitting: "Endocrinology

## 2015-06-12 ENCOUNTER — Ambulatory Visit (INDEPENDENT_AMBULATORY_CARE_PROVIDER_SITE_OTHER): Payer: 59 | Admitting: "Endocrinology

## 2015-06-12 VITALS — BP 120/82 | HR 126 | Wt 167.0 lb

## 2015-06-12 DIAGNOSIS — E1043 Type 1 diabetes mellitus with diabetic autonomic (poly)neuropathy: Secondary | ICD-10-CM | POA: Diagnosis not present

## 2015-06-12 DIAGNOSIS — E063 Autoimmune thyroiditis: Secondary | ICD-10-CM

## 2015-06-12 DIAGNOSIS — E1065 Type 1 diabetes mellitus with hyperglycemia: Principal | ICD-10-CM

## 2015-06-12 DIAGNOSIS — I10 Essential (primary) hypertension: Secondary | ICD-10-CM

## 2015-06-12 DIAGNOSIS — E049 Nontoxic goiter, unspecified: Secondary | ICD-10-CM

## 2015-06-12 DIAGNOSIS — K141 Geographic tongue: Secondary | ICD-10-CM

## 2015-06-12 DIAGNOSIS — R000 Tachycardia, unspecified: Secondary | ICD-10-CM | POA: Diagnosis not present

## 2015-06-12 DIAGNOSIS — E109 Type 1 diabetes mellitus without complications: Secondary | ICD-10-CM | POA: Diagnosis not present

## 2015-06-12 DIAGNOSIS — IMO0001 Reserved for inherently not codable concepts without codable children: Secondary | ICD-10-CM

## 2015-06-12 DIAGNOSIS — E10649 Type 1 diabetes mellitus with hypoglycemia without coma: Secondary | ICD-10-CM

## 2015-06-12 DIAGNOSIS — E1042 Type 1 diabetes mellitus with diabetic polyneuropathy: Secondary | ICD-10-CM

## 2015-06-12 LAB — GLUCOSE, POCT (MANUAL RESULT ENTRY): POC GLUCOSE: 186 mg/dL — AB (ref 70–99)

## 2015-06-12 NOTE — Progress Notes (Signed)
Subjective:  Patient Name: Victoria Atkinson Date of Birth: 03-Oct-1990  MRN: [id number]  Victoria Atkinson  presents to the office today for follow-up evaluation and management of her type 1 diabetes mellitus, goiter, hypoglycemia, peripheral neuropathy, autonomic neuropathy with inappropriate sinus tachycardia, headaches, Hashimoto's disease, geographic tongue, and hypertension.  HISTORY OF PRESENT ILLNESS:   Victoria Atkinson is a 25 y.o. Caucasian young woman.  Victoria Atkinson was unaccompanied.   1. The patient was first referred to me on 10/31/04 by Dr. Crista Luria from [facility] for evaluation and management of new onset type 1 diabetes mellitus. The patient was 25 years old.  A. The patient had about a 2 month period of polyuria, polydipsia, nocturia, upset stomach, nausea, stomach pains, and a documented 17 pound weight loss. She was often dizzy and lightheaded. At her PCP's office a serum glucose was 357. A serum CO2 was 26. I arranged to see the patient that day. 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 Family history was positive for a cousin with type 1 diabetes mellitus, a maternal grandmother with a "sluggish" thyroid, and a maternal aunt who took thyroid medicine. On physical examination her height was 65-1/2 inches. She weighed 99-1/2 pounds. Her BMI was 16.4. Her CBG was High, which was greater than 500 on our meter. Her hemoglobin A1c was 12.6%.  B. We provided diabetes education to the patient and her family in our office. I started her on Novolog by sliding scale. I subsequently changed the Novolog to our two-component method with a correction dose and food dose at mealtimes. I also added Lantus insulin.  2. During the past ten years, the patient has done well overall. She had a good honeymoon period that lasted about 9 months, during which her hemoglobin A1c's varied from 6.0-6.3%. On 08/23/2005 we started her on insulin pump therapy with a Medtronic Paradigm  722 insulin pump. Since then, her hemoglobin A1c values have varied from 6.5-8.3%. She converted to a Medtronic 530G pump in the spring of 2015.   3. On 10/21/2005, as part of an evaluation for headaches, an MRI was performed. A 3.1 x 6.2 x 11.0 cm posterior fossa arachnoid cyst was noted. Subsequent evaluation by neurosurgeons at [facility] and at [facility] felt that she had a mega cisterna magna rather than a cyst. This was considered to be normal variant and was not felt to be causing her headaches. She was told that surgery would not be required.  4. The patient's last [facility] visit was on 1011/16. In the interim, she had been healthy. She had more IBS symptoms last month. She has since cut out dairy and is doing better. The swelling and pain in her hands and knees has not recurred. She no longer has frequent headaches.   A. She is still following the gluten-free diet and is now also dairy-free.   B. She wore her Enlite CGM sensor for about two months after her last visit, but then just stopped. When she uses the sensor it works well and her BGs are better because she is more aware.   C. She uses Novolog aspart in her Medtronic 530G pump. She still takes lisinopril, 5 mg/day. She is not taking her MVI.  5. Pertinent Review of Systems:  Constitutional: The patient says "I'm OK.". She has been healthy.  Eyes: Vision is good with her glasses. Her last exam was in about May 2016. She had no signs of diabetic  eye disease. There are no significant eye complaints. Mouth: She frequently misses her vitamins, so she still has her geographic tongue..   Neck: The patient has had some recent anterior neck soreness and pressure sensations. She thinks that her goiter is probably bigger.   Heart: Heart rate increases with exercise or other physical activity. The patient has no complaints of palpitations, irregular heat beats, chest pain, or chest pressure. Gastrointestinal: As above.  She has not had any reflux. She is not having any other GI complaints now. The patient has no complaints of excessive hunger, acid reflux, upset stomach, stomach aches or other pains, diarrhea, or constipation. Legs: Muscle mass and strength seem normal. There are no other complaints of numbness, tingling, burning, or pain. No edema is noted. Feet: She had surgery on her left ingrown toenails in late June or early July. There are no other obvious foot problems. There are no other complaints of numbness, tingling, burning, or pain. No edema is noted. GYN: She remains on oral contraceptive pills. Her LMP was 2-3 weeks ago.   Hypoglycemia: She has not has many low BGs. When she does have low BGs, they usually occur a few hours after being at work.       6. BG printout: She changes her sites every 1-3 days. She checks BGs 3-5 times per day. She boluses 1-5 times per day, usually 3-4 times. Her average BG is 223, compared with 211 at last visit and with 184 at the visit prior. BGs are quite variable, with a range of 58 to >400, compared with 51 to > 400 at last visit and with 46- >400 at the prior visit. BGs tend to increase the longer her sites remain in place. Most of her higher BGs occur when there is a big gap in time from when she goes to sleep in the mornings until she gets up in the evenings. She does not usually eat from 4 AM until about 4-5 PM. Most low BGs occur from 2 AM-10 AM. Most low morning  BGs occur when she has taken large correction doses from 10 PM to 2 AM. .    PAST MEDICAL, FAMILY, AND SOCIAL HISTORY:  Past Medical History  Diagnosis Date  . Type 1 diabetes mellitus not at goal [facility])   . Hypoglycemia associated with diabetes (HCC)   . Goiter   . DM type 1 with diabetic peripheral neuropathy (HCC)   . Hashimoto's disease   . Hypertension   . Orthostatic hypotension   . Brain cyst     Family History  Problem Relation Age of Onset  . Thyroid disease Maternal Aunt      Hypothyroid, takes thyroid medicine  . Thyroid disease Maternal Grandmother     Sluggish thyroid  . Cancer Paternal Grandfather   . Diabetes Cousin     T1 DM     Current outpatient prescriptions:  .  Bisacodyl (DULCOLAX PO), Take by mouth., Disp: , Rfl:  .  dicyclomine (BENTYL) 20 MG tablet, Take 20 mg by mouth every 6 (six) hours., Disp: , Rfl:  .  insulin aspart (NOVOLOG) 100 UNIT/ML injection, USE 300 UNITS EVERY 48 HOURS IN INSULIN PUMP, Disp: 15 vial, Rfl: 4 .  lisinopril (PRINIVIL,ZESTRIL) 5 MG tablet, Take 1 tablet (5 mg total) by mouth daily., Disp: 90 tablet, Rfl: 4 .  loratadine (CLARITIN) 10 MG tablet, Take 10 mg by mouth daily., Disp: , Rfl:  .  Norethin Ace-Eth Estrad-FE (LOMEDIA 24 FE PO), Take  by mouth., Disp: , Rfl:   Allergies as of 06/12/2015 - Review Complete 02/21/2015  Allergen Reaction Noted  . Zithromax [azithromycin] Hives 08/21/2010    1. Work and Family: She graduated from [employer] in 2014. 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 She wants to go to grad school for a PhD in microbiology at some time in the future.  2. Activities: Victoria Atkinson has not been working out much. 3. Smoking, alcohol, or drugs: None 4. Primary Care Provider: Dr. Everlene Other at [facility]  REVIEW OF SYSTEMS: She is allergic to raspberries and poison ivy. There are no other significant problems involving Maurine's other body systems.   Objective:  Vital Signs:  BP 120/82 mmHg  Pulse 126  Wt 167 lb (75.751 kg) .    Ht Readings from Last 3 Encounters:  12/13/12  (1.702 m)  09/16/12 5' 7.09" (1.704 m)  03/11/07  (1.727 m) (94 %*, Z = 1.56)   * Growth percentiles are based on CDC 2-20 Years data.   Wt Readings from Last 3 Encounters:  06/12/15 167 lb (75.751 kg)  02/21/15 162 lb (73.483 kg)  11/16/14 164 lb 1.6 oz (74.435 kg)  Normalized stature-for-age data available only for age 68 to 20 years. Normalized  weight-for-age data available only for age 68 to 20 years.  PHYSICAL EXAM:  Constitutional: The patient appears healthy, but still overweight. She has gained 5 pounds since last visit. Her Ideal Body Weight is 135 pounds. She is very bright, personable, and mature. Face: The face appears normal.  Eyes: There is no obvious arcus or proptosis. Moisture appears normal. Mouth: The oropharynx is normal. Her tongue is just mildly geographic today. Oral moisture is normal. Neck: The neck appears to be visibly normal. No carotid bruits are noted. The thyroid gland is smaller at about 21 grams in size. The left lobe is very mildly enlarged today, but the right lobe has shrunk back to normal size. The consistency of the thyroid gland is normal. The thyroid gland is not tender to palpation. Lungs: The lungs are clear to auscultation. Air movement is good. Heart: Heart rate and rhythm are regular. Heart sounds S1 and S2 are normal. I did not appreciate any pathologic cardiac murmurs. Abdomen: The abdomen is enlarged. Bowel sounds are normal. There is no obvious hepatomegaly, splenomegaly, or other mass effect.  Arms: Muscle size and bulk are normal for age. Hands: There is no obvious tremor. Phalangeal and metacarpophalangeal joints are normal. Palmar muscles are normal. Palmar skin is normal. Palmar moisture is also normal. Legs: Muscles appear normal for age. No edema is present. Feet: Feet are normally formed. Dorsalis pedal pulses are faint 1+ bilaterally. PT pulses are faint 1+ bilaterally.   Neurologic: Strength is normal for age in both the upper and lower extremities. Muscle tone is normal. Sensation to touch is normal in both the legs and feet.   LAB DATA:   Labs 02/21/15: HbA1c 7.6%.   Labs 11/16/14: HbA1c 7.1%  Labs 08/04/14: HbA1c 7.5%, CMP normal except for glucose of 201; urinary microalbumin/creatinine ratio 3.3; TSH 2.937, free T4 1.08, free T3 3.1; cholesterol 197, triglycerides 137, HDL  58, and LDL 112  Labs 01/31/14: Hemoglobin A1c was 7.9%, compared with 7.5% at last visit and with 6.9% at the visit prior.    Labs 01/25/14: HbA1c 8.4%; CMP normal except for glucose of 178; cholesterol 225, triglycerides 276, HDL 77, LDL  92; microalbumin/creatinine ratio 2.6; TSH 2.597, free T4 1.06, free T3 2.9  Labs: 06/22/12: Cholesterol 236, triglycerides 300, HDL 68, LDL, 108 (pre-gluten-free diet); TTG IgA 6.4 (<20); IgA 152 (69-380); urinary microalbumin/creatinine ratio 3.1  Labs: 12/25/11: TSH 1.259, free T4 1.26, free T3 3.2; CMP normal          Assessment and Plan:   ASSESSMENT:  1. T1DM:  BGs are higher as shown by her average BG values. The biggest cause of her higher BGs is the rise of BG from about 8 AM to 6 PM when she is sleeping. When she only  boluses twice a day, at about 3 AM and about 6 PM, she misses the opportunities to do the usual mealtime boluses. Since the amount of bolus insulin is less with her new job schedule than it used to be, we need to increase her basal insulin. Unfortunately, we run the risk of causing more hypoglycemia.    2. Hypoglycemia: She has had 8 BGs in the 50s and 60s.  7/8 occurred in the mornings.   3. Hypertension: Her SBP is good, but her DBP is still too high. I offered her the option of increasing her exercise or increasing her lisinopril. She chose to increase her exercise.  4-6. Goiter/thyroiditis/transient hypothyroidism:   A. Her thyroid gland is smaller. The pattern of waxing and waning of thyroid gland size is c/w evolving Hashimoto's Dz.   B. Her TFTs in March were at the low end of the normal range.  The fact that all three of her TFTs increased from September 2015 to March 2016 is pathognomonic for an interim flare up of Hashimoto's Dz. Although I have put in orders for TFTs twice since then, we have not received any results from [facility]. She needs to repeat her TFTs now.  7. Peripheral neuropathy: The neuropathy is not evident today.   8. Dizziness/orthostatic hypotension: Her symptoms have resolved.   9-10. Autonomic neuropathy and tachycardia: Her autonomic neuropathy has not improved, so her heart rate is much higher, paralleling her weight gain and increased BG levels.    11. Geographic tongue: Her tongue is better. She needs to take her MVI.   PLAN: 1. Diagnostic: TFTs this week. Call me on Sunday evening in two weeks.  2. Therapeutic: Use her sensor. Continue lisinopril at 5 mg/day. Check BGs at bedtime. Resume exercising.  A. New basal rates:   Midnight:  0.550 -> 0.600 4 AM: 1.10 9 AM: 0.450 11 AM: 0.600 -> 0.650 6 PM: 0.550 10 PM: 0.275 -> 0.300 B. New BG targets: MN: 150 -> 180 4 AM -> 6 AM: 110 9 PM: 150 -> 180 3. Patient education: We discussed issues of hyperglycemia, hypoglycemia, thyroiditis, weight gain, dehydration, and hypertension at length. 4. Follow-up: 3 months  Level of Service: This visit lasted in excess of 55 minutes. More than 50% of the visit was devoted to counseling.  [name] Stall, MD 06/12/2015 8:59 AM

## 2015-06-12 NOTE — Patient Instructions (Signed)
Follow up visit in 3 months. Please call Dr. Fransico Prim Morace in two weeks on Sunday evening between 8:00-9:30 PM.

## 2015-06-17 ENCOUNTER — Encounter (HOSPITAL_BASED_OUTPATIENT_CLINIC_OR_DEPARTMENT_OTHER): Payer: Self-pay | Admitting: Emergency Medicine

## 2015-06-17 ENCOUNTER — Emergency Department (HOSPITAL_BASED_OUTPATIENT_CLINIC_OR_DEPARTMENT_OTHER)
Admission: EM | Admit: 2015-06-17 | Discharge: 2015-06-17 | Disposition: A | Discharge status: Home / Self Care | Payer: 59 | Attending: Physician Assistant | Admitting: Physician Assistant

## 2015-06-17 DIAGNOSIS — J029 Acute pharyngitis, unspecified: Secondary | ICD-10-CM | POA: Diagnosis present

## 2015-06-17 DIAGNOSIS — Z79899 Other long term (current) drug therapy: Secondary | ICD-10-CM | POA: Insufficient documentation

## 2015-06-17 DIAGNOSIS — Z8669 Personal history of other diseases of the nervous system and sense organs: Secondary | ICD-10-CM | POA: Insufficient documentation

## 2015-06-17 DIAGNOSIS — E1042 Type 1 diabetes mellitus with diabetic polyneuropathy: Secondary | ICD-10-CM | POA: Diagnosis not present

## 2015-06-17 DIAGNOSIS — Z794 Long term (current) use of insulin: Secondary | ICD-10-CM | POA: Diagnosis not present

## 2015-06-17 DIAGNOSIS — J028 Acute pharyngitis due to other specified organisms: Secondary | ICD-10-CM

## 2015-06-17 DIAGNOSIS — I10 Essential (primary) hypertension: Secondary | ICD-10-CM | POA: Diagnosis not present

## 2015-06-17 DIAGNOSIS — B9789 Other viral agents as the cause of diseases classified elsewhere: Secondary | ICD-10-CM

## 2015-06-17 LAB — RAPID STREP SCREEN (MED CTR MEBANE ONLY): Streptococcus, Group A Screen (Direct): NEGATIVE

## 2015-06-17 NOTE — ED Notes (Signed)
ED NP into room 

## 2015-06-17 NOTE — ED Notes (Signed)
Pt in c/o waking this evening with a sore throat and noticed white patches on her tonsils. Airway intact, handling secretions, NAD.

## 2015-06-17 NOTE — Discharge Instructions (Signed)
Take Tylenol and Advil as needed for pain return as needed for worsening symptoms. Use salt water gargles and Chloraseptic spray.

## 2015-06-17 NOTE — ED Provider Notes (Signed)
CSN: [id number]     Arrival date & time 06/17/15  1951 History   First MD Initiated Contact with Patient 06/17/15 1959     Chief Complaint  Patient presents with  . Sore Throat     (Consider location/radiation/quality/duration/timing/severity/associated sxs/prior Treatment) Patient is a 25 y.o. female presenting with pharyngitis. The history is provided by the patient.  Sore Throat This is a new problem. The current episode started today. The problem occurs constantly. The problem has been unchanged. Associated symptoms include a sore throat.   ALEKSANDRA RABEN is a 25 y.o. female who presents to the ED with sore throat that started today. She reports she woke approximately 7pm and her throat felt sore and she thought her glands may be swollen. She has taken nothing for pain.   Past Medical History  Diagnosis Date  . Type 1 diabetes mellitus not at goal [facility])   . Hypoglycemia associated with diabetes (HCC)   . Goiter   . DM type 1 with diabetic peripheral neuropathy (HCC)   . Hashimoto's disease   . Hypertension   . Orthostatic hypotension   . Brain cyst    Past Surgical History  Procedure Laterality Date  . None     Family History  Problem Relation Age of Onset  . Thyroid disease Maternal Aunt     Hypothyroid, takes thyroid medicine  . Thyroid disease Maternal Grandmother     Sluggish thyroid  . Cancer Paternal Grandfather   . Diabetes Cousin     T1 DM   Social History  Substance Use Topics  . Smoking status: Never Smoker   . Smokeless tobacco: Never Used  . Alcohol Use: No   OB History    No data available     Review of Systems  HENT: Positive for sore throat.   all other systems negative    Allergies  Zithromax  Home Medications   Prior to Admission medications   Medication Sig Start Date End Date Taking? Authorizing Provider  Bisacodyl (DULCOLAX PO) Take by mouth.    Historical Provider, MD  dicyclomine (BENTYL) 20 MG tablet Take 20 mg by mouth  every 6 (six) hours.    Historical Provider, MD  insulin aspart (NOVOLOG) 100 UNIT/ML injection USE 300 UNITS EVERY 48 HOURS IN INSULIN PUMP 05/11/15   [name] Stall, MD  lisinopril (PRINIVIL,ZESTRIL) 5 MG tablet Take 1 tablet (5 mg total) by mouth daily. 05/11/15   [name] Stall, MD  loratadine (CLARITIN) 10 MG tablet Take 10 mg by mouth daily.    Historical Provider, MD  Norethin Ace-Eth Estrad-FE (LOMEDIA 24 FE PO) Take by mouth.    Historical Provider, MD   BP 127/77 mmHg  Pulse 88  Temp(Src) 98.2 F (36.8 C) (Oral)  Resp 18  Wt 74.844 kg  SpO2 100%  LMP 05/21/2015 Physical Exam  Constitutional: She is oriented to person, place, and time. She appears well-developed and well-nourished. No distress.  HENT:  Head: Normocephalic and atraumatic.  Right Ear: Tympanic membrane normal.  Left Ear: Tympanic membrane normal.  Nose: Nose normal.  Mouth/Throat: Uvula is midline and mucous membranes are normal. Posterior oropharyngeal erythema: mild erythema.  Eyes: Conjunctivae and EOM are normal. Pupils are equal, round, and reactive to light.  Neck: Normal range of motion. Neck supple.  Pulmonary/Chest: Effort normal.  Abdominal: Soft. There is no tenderness.  Musculoskeletal: Normal range of motion.  Lymphadenopathy:    She has no cervical adenopathy.  Neurological: She is alert and  oriented to person, place, and time. No cranial nerve deficit.  Skin: Skin is warm and dry.  Nursing note and vitals reviewed.   ED Course  Procedures (including critical care time) Labs Review Results for orders placed or performed during the hospital encounter of 06/17/15 (from the past 24 hour(s))  Rapid strep screen (not at [facility])     Status: None   Collection Time: 06/17/15  8:00 PM  Result Value Ref Range   Streptococcus, Group A Screen (Direct) NEGATIVE NEGATIVE      MDM  25 y.o. female with sore throat that started just a few hours ago stable for d/c without fever, no difficulty  swallowing and negative strep screen. Will treat for viral sore throat and send strep screen for culture.  Discussed with the patient and all questioned fully answered. She will take tylenol and ibuprofen as needed for pain and use salt water gargles and chloraseptic spray.  Final diagnoses:  Sore throat (viral)       Janne Napoleon, NP 06/17/15 [id number]  Courteney Randall An, MD 06/17/15 2316

## 2015-06-17 NOTE — ED Notes (Signed)
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

## 2015-06-20 LAB — CULTURE, GROUP A STREP (THRC)

## 2015-07-04 ENCOUNTER — Other Ambulatory Visit: Payer: Self-pay | Admitting: *Deleted

## 2015-07-04 DIAGNOSIS — E1065 Type 1 diabetes mellitus with hyperglycemia: Principal | ICD-10-CM

## 2015-07-04 DIAGNOSIS — IMO0001 Reserved for inherently not codable concepts without codable children: Secondary | ICD-10-CM

## 2015-07-04 MED ORDER — GLUCOSE BLOOD VI STRP: ORAL_STRIP | Status: DC

## 2015-07-06 ENCOUNTER — Other Ambulatory Visit: Payer: Self-pay | Admitting: *Deleted

## 2015-07-06 DIAGNOSIS — IMO0001 Reserved for inherently not codable concepts without codable children: Secondary | ICD-10-CM

## 2015-07-06 DIAGNOSIS — E1065 Type 1 diabetes mellitus with hyperglycemia: Principal | ICD-10-CM

## 2015-07-06 MED ORDER — GLUCOSE BLOOD VI STRP: ORAL_STRIP | Status: DC

## 2015-08-01 ENCOUNTER — Other Ambulatory Visit: Payer: Self-pay | Admitting: *Deleted

## 2015-08-01 ENCOUNTER — Telehealth: Payer: Self-pay | Admitting: *Deleted

## 2015-08-01 ENCOUNTER — Encounter: Payer: Self-pay | Admitting: *Deleted

## 2015-08-01 DIAGNOSIS — IMO0001 Reserved for inherently not codable concepts without codable children: Secondary | ICD-10-CM

## 2015-08-01 DIAGNOSIS — E1065 Type 1 diabetes mellitus with hyperglycemia: Principal | ICD-10-CM

## 2015-08-01 MED ORDER — INSULIN LISPRO 100 UNIT/ML ~~LOC~~ SOLN
SUBCUTANEOUS | Status: DC
Start: 2015-08-01 — End: 2015-08-01

## 2015-08-01 MED ORDER — INSULIN LISPRO 100 UNIT/ML ~~LOC~~ SOLN
SUBCUTANEOUS | Status: DC
Start: 2015-08-01 — End: 2016-08-31

## 2015-08-01 MED ORDER — INSULIN LISPRO 100 UNIT/ML ~~LOC~~ SOLN: SUBCUTANEOUS | Status: DC

## 2015-08-01 NOTE — Telephone Encounter (Signed)
Received TC from Victoria Atkinson requesting a letter stating that she can have better diabetes control if works on a regular shift like 1st shift and eats scheduled meals during the day. Advised will route to Dr. Fransico MichaelBrennan.

## 2015-08-09 ENCOUNTER — Telehealth: Payer: Self-pay | Admitting: "Endocrinology

## 2015-08-09 NOTE — Telephone Encounter (Signed)
Letter up front to pick up 

## 2015-08-10 ENCOUNTER — Telehealth: Payer: Self-pay | Admitting: "Endocrinology

## 2015-08-10 NOTE — Telephone Encounter (Signed)
Dr Fransico MichaelBrennan has this form

## 2015-08-14 NOTE — Telephone Encounter (Signed)
Routed to provider

## 2015-09-20 ENCOUNTER — Ambulatory Visit (INDEPENDENT_AMBULATORY_CARE_PROVIDER_SITE_OTHER): Payer: 59 | Admitting: "Endocrinology

## 2015-09-20 ENCOUNTER — Encounter: Payer: Self-pay | Admitting: *Deleted

## 2015-09-20 ENCOUNTER — Encounter: Payer: Self-pay | Admitting: "Endocrinology

## 2015-09-20 VITALS — BP 116/77 | HR 101 | Wt 170.0 lb

## 2015-09-20 DIAGNOSIS — E063 Autoimmune thyroiditis: Secondary | ICD-10-CM

## 2015-09-20 DIAGNOSIS — IMO0001 Reserved for inherently not codable concepts without codable children: Secondary | ICD-10-CM

## 2015-09-20 DIAGNOSIS — E10649 Type 1 diabetes mellitus with hypoglycemia without coma: Secondary | ICD-10-CM | POA: Diagnosis not present

## 2015-09-20 DIAGNOSIS — K141 Geographic tongue: Secondary | ICD-10-CM

## 2015-09-20 DIAGNOSIS — E1065 Type 1 diabetes mellitus with hyperglycemia: Principal | ICD-10-CM

## 2015-09-20 DIAGNOSIS — I4711 Inappropriate sinus tachycardia, so stated: Secondary | ICD-10-CM

## 2015-09-20 DIAGNOSIS — E049 Nontoxic goiter, unspecified: Secondary | ICD-10-CM

## 2015-09-20 DIAGNOSIS — E109 Type 1 diabetes mellitus without complications: Secondary | ICD-10-CM

## 2015-09-20 DIAGNOSIS — I10 Essential (primary) hypertension: Secondary | ICD-10-CM

## 2015-09-20 DIAGNOSIS — E1043 Type 1 diabetes mellitus with diabetic autonomic (poly)neuropathy: Secondary | ICD-10-CM

## 2015-09-20 DIAGNOSIS — R000 Tachycardia, unspecified: Secondary | ICD-10-CM

## 2015-09-20 DIAGNOSIS — E1042 Type 1 diabetes mellitus with diabetic polyneuropathy: Secondary | ICD-10-CM

## 2015-09-20 LAB — POCT GLYCOSYLATED HEMOGLOBIN (HGB A1C): HEMOGLOBIN A1C: 7.6

## 2015-09-20 LAB — GLUCOSE, POCT (MANUAL RESULT ENTRY): POC GLUCOSE: 139 mg/dL — AB (ref 70–99)

## 2015-09-20 NOTE — Progress Notes (Signed)
Subjective:  Patient Name: Victoria Atkinson Date of Birth: 01-30-1991  MRN: [id number]  Victoria Atkinson  presents to the office today for follow-up evaluation and management of her type 1 diabetes mellitus, goiter, hypoglycemia, peripheral neuropathy, autonomic neuropathy with inappropriate sinus tachycardia, headaches, Hashimoto's disease, geographic tongue, and hypertension.  HISTORY OF PRESENT ILLNESS:   Victoria Atkinson is a 25 y.o. Caucasian young woman.  Victoria Atkinson was unaccompanied.   1. The patient was first referred to me on 10/31/04 by Dr. Crista Luria from [facility] for evaluation and management of new onset type 1 diabetes mellitus. The patient was 25 years old.  A. The patient had about a 2 month period of polyuria, polydipsia, nocturia, upset stomach, nausea, stomach pains, and a documented 17 pound weight loss. She was often dizzy and lightheaded. At her PCP's office a serum glucose was 357. A serum CO2 was 26. I arranged to see the patient that day. 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 Family history was positive for a cousin with type 1 diabetes mellitus, a maternal grandmother with a "sluggish" thyroid, and a maternal aunt who took thyroid medicine. On physical examination her height was 65-1/2 inches. She weighed 99-1/2 pounds. Her BMI was 16.4. Her CBG was High, which was greater than 500 on our meter. Her hemoglobin A1c was 12.6%.  B. We provided diabetes education to the patient and her family in our office. I started her on Novolog by sliding scale. I subsequently changed the Novolog to our two-component method with a correction dose and food dose at mealtimes. I also added Lantus insulin.  2. During the past eleven years, the patient has done well overall. She had a good honeymoon period that lasted about 9 months, during which her hemoglobin A1c's varied from 6.0-6.3%. On 08/23/2005 we started her on insulin pump therapy with a Medtronic  Paradigm 722 insulin pump. Since then, her hemoglobin A1c values have varied from 6.5-8.3%. She converted to a Medtronic 530G pump in the spring of 2015.   3. On 10/21/2005, as part of an evaluation for headaches, an MRI was performed. A 3.1 x 6.2 x 11.0 cm posterior fossa arachnoid cyst was noted. Subsequent evaluation by neurosurgeons at [facility] and at [facility] felt that she had a mega cisterna magna rather than a cyst. This was considered to be a normal variant and was not felt to be causing her headaches. She was told that surgery would not be required.  4. The patient's last [facility] visit was on 06/12/15. In the interim, she has been healthy. The changes in her pump settings made at her last visit helped a lot.  The swelling and pain in her hands and knees has not recurred. She no longer has frequent headaches.   A. She is still following the gluten-free diet. She has increased the amount of non-milk dairy products she consumes.   B. She does not usually wear her Enlite CGM sensor. When she uses the sensor it works well and her BGs are better because she is more aware.   C. She uses Novolog aspart in her Medtronic 530G pump. She still takes lisinopril, 5 mg/day. She is taking her MVI more often.  D. She will change her work hours to the day shift next week, working 7:30 AM to 4 PM, Monday-Friday.  5. Pertinent Review of Systems:  Constitutional: The patient says "I'm OK.". She has been healthy.  Eyes: Vision is  good with her glasses. Her last exam was in about May 2016. She had no signs of diabetic eye disease. There are no significant eye complaints. Mouth: She has not been bothered by geographic tongue.   Neck: The patient has had occasional anterior neck soreness and pressure sensations.  Heart: She has occasional heart racing at rest, probably induced by caffeine. Heart rate increases with exercise or other physical activity. The patient has no complaints of  palpitations, irregular heat beats, chest pain, or chest pressure. Gastrointestinal: Her IBS is better, but her reflux is worse. Her reflux varies with certain foods, such as tomatoes and citrus. She is not having any other GI complaints now. The patient has no complaints of excessive hunger, upset stomach, stomach aches or other pains, diarrhea, or constipation. Legs: Muscle mass and strength seem normal. She had burning and tingling of the lower half of her calves last month for about a few days. Those symptoms spontaneously resolved. There are no other complaints of numbness, tingling, burning, or pain. No edema is noted. Feet: There are no obvious foot problems. There are no other complaints of numbness, tingling, burning, or pain. No edema is noted. GYN: She remains on oral contraceptive pills. Her LMP was 3 weeks ago.   Hypoglycemia: She has not has many low BGs. When she does have low BGs, they usually occur a few hours after being at work.       6. BG printout: She changes her sites every 3-4 days. She checks BGs 2-5 times per day. She boluses 2-5 times per day, usually 3-4 times. Her average BG is 215, compared with 223 at last visit and with 211 at the visit prior. BGs are quite variable, with a range of 42 to >400, compared with 58 to > 400 at last visit and with 52 to >400 at the prior visit. BGs tend to increase the longer her sites remain in place. Most of her higher BGs occur when there is a big gap in time from when she goes to sleep in the mornings until she gets up in the evenings. She does not usually eat from 4 AM until about 4-5 PM. Most low BGs following treatment of high BGs.  PAST MEDICAL, FAMILY, AND SOCIAL HISTORY:  Past Medical History  Diagnosis Date  . Type 1 diabetes mellitus not at goal [facility])   . Hypoglycemia associated with diabetes (HCC)   . Goiter   . DM type 1 with diabetic peripheral neuropathy (HCC)   . Hashimoto's disease   . Hypertension   . Orthostatic  hypotension   . Brain cyst     Family History  Problem Relation Age of Onset  . Thyroid disease Maternal Aunt     Hypothyroid, takes thyroid medicine  . Thyroid disease Maternal Grandmother     Sluggish thyroid  . Cancer Paternal Grandfather   . Diabetes Cousin     T1 DM     Current outpatient prescriptions:  .  Bisacodyl (DULCOLAX PO), Take by mouth., Disp: , Rfl:  .  dicyclomine (BENTYL) 20 MG tablet, Take 20 mg by mouth every 6 (six) hours., Disp: , Rfl:  .  glucose blood (ONE TOUCH ULTRA TEST) test strip, Check sugar 10 x daily, Disp: 900 each, Rfl: 4 .  insulin aspart (NOVOLOG) 100 UNIT/ML injection, USE 300 UNITS EVERY 48 HOURS IN INSULIN PUMP, Disp: 15 vial, Rfl: 4 .  insulin lispro (HUMALOG) 100 UNIT/ML injection, Use 300 units every 48 hours in insulin  pump, Disp: 15 vial, Rfl: 3 .  lisinopril (PRINIVIL,ZESTRIL) 5 MG tablet, Take 1 tablet (5 mg total) by mouth daily., Disp: 90 tablet, Rfl: 4 .  loratadine (CLARITIN) 10 MG tablet, Take 10 mg by mouth daily., Disp: , Rfl:  .  Norethin Ace-Eth Estrad-FE (LOMEDIA 24 FE PO), Take by mouth., Disp: , Rfl:   Allergies as of 09/20/2015 - Review Complete 06/17/2015  Allergen Reaction Noted  . Zithromax [azithromycin] Hives 08/21/2010    1. Work and Family: She graduated from [employer] in 2014. 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 She is still contemplating going to grad school for a PhD in microbiology at some time in the future.  2. Activities: Victoria Atkinson has not been working out much. 3. Smoking, alcohol, or drugs: None 4. Primary Care Provider: [facility] Everlene OtherBouska at [facility]  REVIEW OF SYSTEMS: She is allergic to raspberries and poison ivy. There are no other significant problems involving Bradyn's other body systems.   Objective:  Vital Signs:  BP 116/77 mmHg  Pulse 101  Wt 170 lb (77.111 kg) .    Ht Readings from Last 3  Encounters:  12/13/12 5\' 7"  (1.702 m)  09/16/12 5' 7.09" (1.704 m)  03/11/07 5\' 8"  (1.727 m) (94 %*, Z = 1.56)   * Growth percentiles are based on CDC 2-20 Years data.   Wt Readings from Last 3 Encounters:  09/20/15 170 lb (77.111 kg)  06/17/15 165 lb (74.844 kg)  06/12/15 167 lb (75.751 kg)  Facility age limit for growth percentiles is 20 years. Facility age limit for growth percentiles is 20 years.  PHYSICAL EXAM:  Constitutional: The patient appears healthy, but still overweight. She has gained 3 pounds since last visit. Her Ideal Body Weight is 135 pounds. She is very bright, personable, and mature. Face: The face appears normal.  Eyes: There is no obvious arcus or proptosis. Moisture appears normal. Mouth: The oropharynx is normal. Her tongue is just mildly geographic today. Oral moisture is normal. Neck: The neck appears to be visibly normal. No carotid bruits are noted. The thyroid gland is mildly enlarged at about 21-22 grams in size. Both lobes are mildly enlarged today, compared with the left lobe being very mildly enlarged at last visit, but the right lobe having shrunk back to normal size. The consistency of the thyroid gland is normal. The thyroid gland is not tender to palpation. Lungs: The lungs are clear to auscultation. Air movement is good. Heart: Heart rate and rhythm are regular. Heart sounds S1 and S2 are normal. I did not appreciate any pathologic cardiac murmurs. Abdomen: The abdomen is enlarged. Bowel sounds are normal. There is no obvious hepatomegaly, splenomegaly, or other mass effect.  Arms: Muscle size and bulk are normal for age. Hands: There is no obvious tremor. Phalangeal and metacarpophalangeal joints are normal. Palmar muscles are normal. Palmar skin is normal. Palmar moisture is also normal. Legs: Muscles appear normal for age. No edema is present. Feet: Feet are normally formed. Dorsalis pedal pulses are faint 1+ bilaterally. PT pulses are faint 1+  bilaterally.   Neurologic: Strength is normal for age in both the upper and lower extremities. Muscle tone is normal. Sensation to touch is normal in both the legs and feet.   LAB DATA:   Labs 09/20/15: HbA1c 7.6%  Labs 02/21/15: HbA1c 7.6%.   Labs 11/16/14: HbA1c 7.1%  Labs 08/04/14: HbA1c 7.5%, CMP normal except for glucose of 201; urinary microalbumin/creatinine ratio 3.3; TSH 2.937, free T4 1.08, free T3 3.1; cholesterol 197, triglycerides 137, HDL 58, and LDL 112  Labs 1/61/09: Hemoglobin A1c was 7.9%, compared with 7.5% at last visit and with 6.9% at the visit prior.    Labs 01/25/14: HbA1c 8.4%; CMP normal except for glucose of 178; cholesterol 225, triglycerides 276, HDL 77, LDL 92; microalbumin/creatinine ratio 2.6; TSH 2.597, free T4 1.06, free T3 2.9  Labs: 06/22/12: Cholesterol 236, triglycerides 300, HDL 68, LDL, 108 (pre-gluten-free diet); TTG IgA 6.4 (<20); IgA 152 (69-380); urinary microalbumin/creatinine ratio 3.1  Labs: 12/25/11: TSH 1.259, free T4 1.26, free T3 3.2; CMP normal          Assessment and Plan:   ASSESSMENT:  1. T1DM:  BGs are somewhat lower as shown by her average BG values, but her HbA1c is unchanged. The biggest cause of her higher BGs is the rise of BG from about 8 AM to 6 PM when she is sleeping. When she only  boluses twice a day, at about 3 AM and about 6 PM, she misses the opportunities to do the usual mealtime boluses. Since she will be changing shifts next week, it does not make any sense to change her pump settings at this time, but we will probably need to make some changes soon. 2. Hypoglycemia: She has had 5 BGs <80, the lowest being 42. Her lower  BGs occurred after treatment of higher BGs. 3. Hypertension: Her BP is good today.   4-6. Goiter/thyroiditis/transient hypothyroidism:   A. Her thyroid gland is slightly larger today. The pattern of waxing and waning of thyroid gland size is c/w evolving Hashimoto's Dz.   B. Her TFTs in March were at the  low end of the normal range.  The fact that all three of her TFTs increased from September 2015 to March 2016 is pathognomonic for an interim flare up of Hashimoto's Dz. Although I have put in orders for TFTs twice since then, we have not received any results from [facility]. She will fax them to [place] next week.   7. Peripheral neuropathy: The neuropathy is not evident today, but was present about one month ago. .  8. Dizziness/orthostatic hypotension: Her symptoms have resolved.   9-10. Autonomic neuropathy and tachycardia: Her autonomic neuropathy has not improved, so her heart rate is still elevated.  It appears that her higher BGs are the culprit.     11. Geographic tongue: Her tongue is better. She needs to take her MVI daily.   PLAN: 1. Diagnostic: TFTs results from [facility]. Call me on Sunday evening in two weeks. Upload BG data to the [employer] and forward it to [place].  2. Therapeutic: Use her sensor. Continue lisinopril at 5 mg/day. Check BGs at bedtime. Resume exercising. Continue current basal rates.  3. Patient education: We discussed issues of hyperglycemia, hypoglycemia, thyroiditis, weight gain, dehydration, and hypertension at length. 4. Follow-up: 3 months  Level of Service: This visit lasted in excess of 55 minutes. More than 50% of the visit was devoted to counseling.  [name] Stall, MD 09/20/2015 3:19 PM

## 2015-09-20 NOTE — Patient Instructions (Signed)
Follow up visit in 3 months. 

## 2015-12-26 ENCOUNTER — Ambulatory Visit (INDEPENDENT_AMBULATORY_CARE_PROVIDER_SITE_OTHER): Payer: 59 | Admitting: "Endocrinology

## 2015-12-26 VITALS — BP 120/78 | HR 85 | Wt 172.0 lb

## 2015-12-26 DIAGNOSIS — K141 Geographic tongue: Secondary | ICD-10-CM

## 2015-12-26 DIAGNOSIS — I10 Essential (primary) hypertension: Secondary | ICD-10-CM

## 2015-12-26 DIAGNOSIS — E1065 Type 1 diabetes mellitus with hyperglycemia: Principal | ICD-10-CM

## 2015-12-26 DIAGNOSIS — E038 Other specified hypothyroidism: Secondary | ICD-10-CM | POA: Diagnosis not present

## 2015-12-26 DIAGNOSIS — E109 Type 1 diabetes mellitus without complications: Secondary | ICD-10-CM

## 2015-12-26 DIAGNOSIS — E10649 Type 1 diabetes mellitus with hypoglycemia without coma: Secondary | ICD-10-CM | POA: Diagnosis not present

## 2015-12-26 DIAGNOSIS — R000 Tachycardia, unspecified: Secondary | ICD-10-CM

## 2015-12-26 DIAGNOSIS — E049 Nontoxic goiter, unspecified: Secondary | ICD-10-CM | POA: Diagnosis not present

## 2015-12-26 DIAGNOSIS — E063 Autoimmune thyroiditis: Secondary | ICD-10-CM

## 2015-12-26 DIAGNOSIS — E1043 Type 1 diabetes mellitus with diabetic autonomic (poly)neuropathy: Secondary | ICD-10-CM

## 2015-12-26 DIAGNOSIS — IMO0001 Reserved for inherently not codable concepts without codable children: Secondary | ICD-10-CM

## 2015-12-26 DIAGNOSIS — E1042 Type 1 diabetes mellitus with diabetic polyneuropathy: Secondary | ICD-10-CM

## 2015-12-26 LAB — POCT GLYCOSYLATED HEMOGLOBIN (HGB A1C): HEMOGLOBIN A1C: 8.1

## 2015-12-26 LAB — GLUCOSE, POCT (MANUAL RESULT ENTRY): POC GLUCOSE: 279 mg/dL — AB (ref 70–99)

## 2015-12-26 NOTE — Progress Notes (Signed)
Subjective:  Patient Name: Victoria Atkinson Date of Birth: 14-Feb-1991  MRN: [id number]  Victoria Atkinson  presents to the office today for follow-up evaluation and management of her type 1 diabetes mellitus, goiter, hypoglycemia, peripheral neuropathy, autonomic neuropathy with inappropriate sinus tachycardia, migraine headaches, Hashimoto's disease, geographic tongue, and hypertension.  HISTORY OF PRESENT ILLNESS:   Victoria Atkinson is a 25 y.o. Caucasian young woman.  Victoria Atkinson was unaccompanied.   1. The patient was first referred to me on 10/31/04 by Dr. Crista LuriaFrieda Menzer from [facility] for evaluation and management of new onset type 1 diabetes mellitus. The patient was 25 years old.  A. The patient had about a 2 month period of polyuria, polydipsia, nocturia, upset stomach, nausea, stomach pains, and a documented 17 pound weight loss. She was often dizzy and lightheaded. At her PCP's office a serum glucose was 357. A serum CO2 was 26. I arranged to see the patient that day. 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 Family history was positive for a cousin with type 1 diabetes mellitus, a maternal grandmother with a "sluggish" thyroid, and a maternal aunt who took thyroid medicine. On physical examination her height was 65-1/2 inches. She weighed 99-1/2 pounds. Her BMI was 16.4. Her CBG was High, which was greater than 500 on our meter. Her hemoglobin A1c was 12.6%.  B. We provided diabetes education to the patient and her family in our office. I started her on a Novolog sliding scale regimen. I subsequently changed the Novolog to our two-component method with a correction dose and food dose at mealtimes. I also added Lantus insulin.  2. During the past eleven years, the patient has done fairly well overall. She had a good honeymoon period that lasted about 9 months, during which her hemoglobin A1c's varied from 6.0-6.3%. On 08/23/2005 we started her on insulin pump therapy  with a Medtronic Paradigm 722 insulin pump. Since then, her hemoglobin A1c values have varied from 6.5-8.3%. She converted to a Medtronic 530G pump in the spring of 2015.   3. On 10/21/2005, as part of an evaluation for headaches, an MRI was performed. A 3.1 x 6.2 x 11.0 cm posterior fossa arachnoid cyst was noted. Subsequent evaluation [facility] felt that she had a mega cisterna magna rather than a cyst. This was considered to be a normal variant and was not felt to be causing her headaches. She was told that surgery would not be required.  4. The patient's last [facility] visit was on 09/20/15. In the interim, she has been healthy. The swelling and pain in her hands and knees has not recurred. She had a bad migraine several weeks ago, but without systemic symptoms. .  A. She is still following the gluten-free diet. She still takes some non-milk dairy products.   B. She does not usually wear her Enlite CGM sensor. When she uses the sensor it works well and her BGs are better because she is more aware.   C. She uses Humalog lispro in her Medtronic 530G pump. She still takes lisinopril, 5 mg/day. She is taking her MVI as often as she remembers.    5. Pertinent Review of Systems:  Constitutional: The patient feels "pretty good". She has been healthy.  Eyes: Vision is good with her glasses. Her last exam was in about May 2017. She had no signs of diabetic eye disease. There are no significant eye complaints  today. Mouth: She has occasionally been bothered by geographic tongue.   Neck: The patient has had occasional anterior neck soreness and pressure sensations.  Heart: She has not had much heart racing at rest since cutting back on caffeine. Heart rate increases with exercise or other physical activity. The patient has no complaints of palpitations, irregular heat beats, chest pain, or chest pressure. Gastrointestinal: She still has some  postprandial bloating at times. Her IBS is better. Her reflux has resolved. She can even eat tomatoes now. She is not having any other GI complaints now. The patient has no complaints of excessive hunger, upset stomach, stomach aches or other pains, diarrhea, or constipation. Legs: Muscle mass and strength seem normal. She had not had any burning and tingling of the lower half of her calves since her last visit. There are no other complaints of numbness, tingling, burning, or pain. No edema is noted. Feet: There are no obvious foot problems. There are no complaints of numbness, tingling, burning, or pain. No edema is noted. GYN: She remains on oral contraceptive pills. Her LMP was 3-4 weeks ago.   Hypoglycemia: She has had some low BGs. When she does have low BGs, they usually occur a few hours before or after lunch.        6. BG printout: She changes her sites every 1-4 days. She checks BGs 3-5 times per day. She boluses 2-5 times per day, usually 3-4 times. Her average BG is 194, compared with 215 at last visit and with 233 at the visit prior. BGs are quite variable, with a range of 51 to >400, compared with 42 to >400 at last visit and with 58 to > 400 at the prior visit. BGs tend to increase the longer her sites remain in place. She had 6 BGs <80, 13 BGs 301-400, and 2 BGs 400.   PAST MEDICAL, FAMILY, AND SOCIAL HISTORY:  Past Medical History:  Diagnosis Date  . Brain cyst   . DM type 1 with diabetic peripheral neuropathy (HCC)   . Goiter   . Hashimoto's disease   . Hypertension   . Hypoglycemia associated with diabetes (HCC)   . Orthostatic hypotension   . Type 1 diabetes mellitus not at goal [facility])     Family History  Problem Relation Age of Onset  . Thyroid disease Maternal Aunt     Hypothyroid, takes thyroid medicine  . Thyroid disease Maternal Grandmother     Sluggish thyroid  . Cancer Paternal Grandfather   . Diabetes Cousin     T1 DM     Current Outpatient Prescriptions:   .  Bisacodyl (DULCOLAX PO), Take by mouth., Disp: , Rfl:  .  dicyclomine (BENTYL) 20 MG tablet, Take 20 mg by mouth every 6 (six) hours., Disp: , Rfl:  .  glucose blood (ONE TOUCH ULTRA TEST) test strip, Check sugar 10 x daily, Disp: 900 each, Rfl: 4 .  insulin aspart (NOVOLOG) 100 UNIT/ML injection, USE 300 UNITS EVERY 48 HOURS IN INSULIN PUMP, Disp: 15 vial, Rfl: 4 .  insulin lispro (HUMALOG) 100 UNIT/ML injection, Use 300 units every 48 hours in insulin pump, Disp: 15 vial, Rfl: 3 .  lisinopril (PRINIVIL,ZESTRIL) 5 MG tablet, Take 1 tablet (5 mg total) by mouth daily., Disp: 90 tablet, Rfl: 4 .  loratadine (CLARITIN) 10 MG tablet, Take 10 mg by mouth daily., Disp: , Rfl:  .  Norethin Ace-Eth Estrad-FE (LOMEDIA 24 FE PO), Take by mouth., Disp: , Rfl:   Allergies  as of 12/26/2015 - Review Complete 09/22/2015  Allergen Reaction Noted  . Zithromax [azithromycin] Hives 08/21/2010    1. Work and Family: She graduated from [employer] in 2014. 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 She is still contemplating going to grad school for a PhD in microbiology at some time in the future.  2. Activities: Victoria Atkinson has not been working out much. 3. Smoking, alcohol, or drugs: None 4. Primary Care Provider: [facility] Everlene [facility]Bouska [facility]  REVIEW OF SYSTEMS: She is allergic to raspberries and poison ivy. There are no other significant problems involving Tanis's other body systems.   Objective:  Vital Signs:  BP 120/78   Pulse 85   Wt 172 lb (78 kg)   BMI 26.94 kg/m  .    Ht Readings from Last 3 Encounters:  12/13/12 5\' 7"  (1.702 m)  09/16/12 5' 7.09" (1.704 m)  03/11/07 5\' 8"  (1.727 m) (94 %, Z= 1.56)*   * Growth percentiles are based on CDC 2-20 Years data.   Wt Readings from Last 3 Encounters:  12/26/15 172 lb (78 kg)  09/20/15 170 lb (77.1 kg)  06/17/15 165 lb (74.8 kg)  Facility age limit for growth percentiles is 20  years. Facility age limit for growth percentiles is 20 years.  PHYSICAL EXAM:  Constitutional: The patient appears healthy, but more overweight. She has gained 2 pounds since last visit. Her Ideal Body Weight is 135 pounds. She is very bright, personable, and mature. Face: The face appears normal.  Eyes: There is no obvious arcus or proptosis. Moisture appears normal. Mouth: The oropharynx is normal. Her tongue is not geographic today. Oral moisture is normal. Neck: The neck appears to be visibly normal. No carotid bruits are noted. The thyroid gland is again mildly enlarged at about 21-22 grams in size. Both lobes are mildly enlarged today, essentially unchanged from her last visit. The consistency of the thyroid gland is normal. The thyroid gland is not tender to palpation. Lungs: The lungs are clear to auscultation. Air movement is good. Heart: Heart rate and rhythm are regular. Heart sounds S1 and S2 are normal. I did not appreciate any pathologic cardiac murmurs. Abdomen: The abdomen is enlarged. Bowel sounds are normal. There is no obvious hepatomegaly, splenomegaly, or other mass effect.  Arms: Muscle size and bulk are normal for age. Hands: There is no obvious tremor. Phalangeal and metacarpophalangeal joints are normal. Palmar muscles are normal. Palmar skin is normal. Palmar moisture is also normal. Legs: Muscles appear normal for age. No edema is present. Feet: Feet are normally formed. Dorsalis pedal pulses are 1+ bilaterally. PT pulses are faint 1+ bilaterally.   Neurologic: Strength is normal for age in both the upper and lower extremities. Muscle tone is normal. Sensation to touch is normal in both the legs and feet.   LAB DATA:   Labs 12/26/15: HbA1c 8.1% today   Labs 09/20/15: HbA1c 7.6%  Labs 02/21/15: HbA1c 7.6%.   Labs 11/16/14: HbA1c 7.1%  Labs 08/04/14: HbA1c 7.5%, CMP normal except for glucose of 201; urinary microalbumin/creatinine ratio 3.3; TSH 2.937, free T4 1.08,  free T3 3.1; cholesterol 197, triglycerides 137, HDL 58, and LDL 112  Labs 1/61/099/21/15: Hemoglobin A1c was 7.9%, compared with 7.5% at last visit and with 6.9% at the visit prior.    Labs 01/25/14: HbA1c 8.4%; CMP normal except for glucose of 178; cholesterol 225, triglycerides 276, HDL 77, LDL 92;  microalbumin/creatinine ratio 2.6; TSH 2.597, free T4 1.06, free T3 2.9  Labs: 06/22/12: Cholesterol 236, triglycerides 300, HDL 68, LDL, 108 (pre-gluten-free diet); TTG IgA 6.4 (<20); IgA 152 (69-380); urinary microalbumin/creatinine ratio 3.1  Labs: 12/25/11: TSH 1.259, free T4 1.26, free T3 3.2; CMP normal          Assessment and Plan:   ASSESSMENT:  1. T1DM:  BGs are somewhat higher as shown by her HbA1c. Her BGs have been somewhat lower in the past month since she's been working on the day shift. The longer her sites stay in place, the higher her BGs tend to be. When her sites are working well her BGs are usually quite good.   2. Hypoglycemia: She has had 6 BGs <80, the lowest being 51. Her lower  BGs occurred more often when she was busy during the day and did not take time out to eat.  3. Hypertension: Her BP is a bit high today. She needs to exercise for at least 4 hours per week. 4-6. Goiter/thyroiditis/transient hypothyroidism:   A. Her thyroid gland is again mildly enlarged today. The pattern of waxing and waning of thyroid gland size is c/w evolving Hashimoto's Dz.   B. Her TFTs in March were at the low end of the normal range.  The fact that all three of her TFTs increased from September 2015 to March 2016 is pathognomonic for an interim flare up of Hashimoto's Dz. Although I have put in orders for TFTs twice since then, we have not received any results from [facility].  7. Peripheral neuropathy: The neuropathy is not evident today.  8. Dizziness/orthostatic hypotension: Her symptoms have resolved.   9-10. Autonomic neuropathy and tachycardia: Her autonomic neuropathy has improved, so her heart  rate is lower.     11. Geographic tongue: Her tongue is normal today.    PLAN: 1. Diagnostic: TFTs results from [facility]. Bring in her meter and sensor in 2-4 weeks for download.   2. Therapeutic: Use her sensor. Continue lisinopril at 5 mg/day. Check BGs at bedtime. Resume exercising. Continue current basal rates. Look into the Medtronic 670G pump. 3. Patient education: We discussed issues of hyperglycemia, hypoglycemia, thyroiditis, weight gain, dehydration, and hypertension at length. 4. Follow-up: 3 months  Level of Service: This visit lasted in excess of 65 minutes. More than 50% of the visit was devoted to counseling.  [name] Stall, MD, CDE Adult and Pediatric Endocrinology 12/26/2015 3:22 PM

## 2015-12-26 NOTE — Patient Instructions (Signed)
Follow up visit in 3 months. Please bring in meter and sensor in 2-4 weeks for download.

## 2016-02-06 ENCOUNTER — Other Ambulatory Visit: Payer: Self-pay | Admitting: *Deleted

## 2016-02-06 ENCOUNTER — Telehealth: Payer: Self-pay

## 2016-02-06 DIAGNOSIS — E1065 Type 1 diabetes mellitus with hyperglycemia: Principal | ICD-10-CM

## 2016-02-06 DIAGNOSIS — IMO0001 Reserved for inherently not codable concepts without codable children: Secondary | ICD-10-CM

## 2016-02-06 MED ORDER — GLUCOSE BLOOD VI STRP: ORAL_STRIP | 4 refills | Status: DC

## 2016-02-06 NOTE — Telephone Encounter (Signed)
Need Rx for one touch test strips. [facility]. [phone]  Dr. Vanessa DurhamBadik provider

## 2016-02-06 NOTE — Telephone Encounter (Signed)
Script sent  

## 2016-02-13 ENCOUNTER — Telehealth (INDEPENDENT_AMBULATORY_CARE_PROVIDER_SITE_OTHER): Payer: Self-pay

## 2016-02-13 NOTE — Telephone Encounter (Signed)
About a year ago patient asked for a letter to be written for Military visitation. Letter to state that she is safe with her  Diabetes with her pump being in use. Mom wants to know if the [facility] was printed [facility].

## 2016-02-14 ENCOUNTER — Encounter (INDEPENDENT_AMBULATORY_CARE_PROVIDER_SITE_OTHER): Payer: Self-pay | Admitting: *Deleted

## 2016-02-14 NOTE — Telephone Encounter (Signed)
Called and asked to call and speak to me so I can write the letter needed.

## 2016-02-15 ENCOUNTER — Telehealth (INDEPENDENT_AMBULATORY_CARE_PROVIDER_SITE_OTHER): Payer: Self-pay

## 2016-02-15 NOTE — Telephone Encounter (Signed)
Mother advised by Rinaldo CloudPamela to come pick up copy of letter.

## 2016-02-15 NOTE — Telephone Encounter (Signed)
Mom said that what was sent yesterday, they need a copy of it for them for patient to keep on her.Mom said the papers that were sent to Mrs. Robb Matarrtiz. Can a copy be sent to their Email  [email or web address]. If it can not be Emailed mom can come by and pick it up.

## 2016-03-28 ENCOUNTER — Encounter (INDEPENDENT_AMBULATORY_CARE_PROVIDER_SITE_OTHER): Payer: Self-pay | Admitting: "Endocrinology

## 2016-03-28 ENCOUNTER — Ambulatory Visit (INDEPENDENT_AMBULATORY_CARE_PROVIDER_SITE_OTHER): Payer: 59 | Admitting: "Endocrinology

## 2016-03-28 VITALS — BP 121/73 | HR 90 | Wt 179.0 lb

## 2016-03-28 DIAGNOSIS — I10 Essential (primary) hypertension: Secondary | ICD-10-CM

## 2016-03-28 DIAGNOSIS — E1043 Type 1 diabetes mellitus with diabetic autonomic (poly)neuropathy: Secondary | ICD-10-CM | POA: Diagnosis not present

## 2016-03-28 DIAGNOSIS — E10649 Type 1 diabetes mellitus with hypoglycemia without coma: Secondary | ICD-10-CM | POA: Diagnosis not present

## 2016-03-28 DIAGNOSIS — Z23 Encounter for immunization: Secondary | ICD-10-CM

## 2016-03-28 DIAGNOSIS — E049 Nontoxic goiter, unspecified: Secondary | ICD-10-CM

## 2016-03-28 DIAGNOSIS — E063 Autoimmune thyroiditis: Secondary | ICD-10-CM | POA: Diagnosis not present

## 2016-03-28 DIAGNOSIS — E1065 Type 1 diabetes mellitus with hyperglycemia: Secondary | ICD-10-CM | POA: Diagnosis not present

## 2016-03-28 DIAGNOSIS — E1042 Type 1 diabetes mellitus with diabetic polyneuropathy: Secondary | ICD-10-CM | POA: Diagnosis not present

## 2016-03-28 DIAGNOSIS — IMO0001 Reserved for inherently not codable concepts without codable children: Secondary | ICD-10-CM

## 2016-03-28 LAB — GLUCOSE, POCT (MANUAL RESULT ENTRY): POC Glucose: 55 mg/dl — AB (ref 70–99)

## 2016-03-28 LAB — POCT GLYCOSYLATED HEMOGLOBIN (HGB A1C): Hemoglobin A1C: 7.6

## 2016-03-28 NOTE — Patient Instructions (Signed)
Follow up visit in 3 months. Please call Dr. Fransico Fatina Sprankle on the first Sunday evening in December between 8:00-9:30 PM.

## 2016-03-28 NOTE — Progress Notes (Signed)
Subjective:  Patient Name: Victoria Atkinson Date of Birth: Oct 02, 1990  MRN: [id number]  Victoria Atkinson  presents to the office today for follow-up evaluation and management of her type 1 diabetes mellitus, goiter, hypoglycemia, peripheral neuropathy, autonomic neuropathy with inappropriate sinus tachycardia, migraine headaches, Hashimoto's disease, geographic tongue, obesity, and hypertension.  HISTORY OF PRESENT ILLNESS:   Victoria Atkinson is a 25 y.o. Caucasian young woman.  Victoria Atkinson was unaccompanied.   1. The patient was first referred to me on 10/31/04 by Dr. Crista Luria from [facility] for evaluation and management of new onset type 1 diabetes mellitus. The patient was 25 years old.  A. The patient had had about a 2 month period of polyuria, polydipsia, nocturia, upset stomach, nausea, stomach pains, and a documented 17 pound weight loss. She was often dizzy and lightheaded. At her PCP's office a serum glucose was 357. A serum CO2 was 26. I arranged to see the patient that day. 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 Family history was positive for a cousin with type 1 diabetes mellitus, a maternal grandmother with a "sluggish" thyroid, and a maternal aunt who took thyroid medicine. On physical examination her height was 65-1/2 inches. She weighed 99-1/2 pounds. Her BMI was 16.4. Her CBG was High, which was greater than 500 on our meter. Her hemoglobin A1c was 12.6%.  B. We provided diabetes education to the patient and her family in our office. I started her on a Novolog sliding scale regimen. I subsequently changed the Novolog to our two-component method with a correction dose and food dose at mealtimes. I also added Lantus insulin.  2. During the past eleven years, the patient has done fairly well overall. She had a good honeymoon period that lasted about 9 months, during which her hemoglobin A1c's varied from 6.0-6.3%. On 08/23/2005 we started her on insulin  pump therapy with a Medtronic Paradigm 722 insulin pump. Since then, her hemoglobin A1c values have varied from 6.5-8.3%. She converted to a Medtronic 530G pump in the spring of 2015.   3. On 10/21/2005, as part of an evaluation for headaches, an MRI was performed. A 3.1 x 6.2 x 11.0 cm posterior fossa arachnoid cyst was noted. Subsequent evaluation by neurosurgeons at [facility] and at [facility] felt that she had a mega cisterna magna rather than a cyst. This was considered to be a normal variant and was not felt to be causing her headaches. She was told that surgery would not be required.  4. The patient's last [facility] visit was on 12/26/15. In the interim, she has been healthy, but had a URI this past weekend that caused her to have higher BGs for several days. She has not had any further migraines.  A. She is still following the gluten-free diet. She still takes some non-milk dairy products.   B. She does not usually wear her Enlite CGM sensor.   C. She uses Humalog lispro in her Medtronic 530G pump. She still takes lisinopril, 5 mg/day. She is taking her MVI as often as she remembers.    5. Pertinent Review of Systems:  Constitutional: The patient feels "good". She has been healthy.  Eyes: Vision is good with her glasses. Her last exam was in about May 2017. She had no signs of diabetic eye disease. There are no significant eye complaints today. Mouth: She has not been bothered by geographic tongue symptoms recently.   Neck: The patient has  not had any recent anterior neck soreness and pressure sensations.  Heart: She has not had any heart racing at rest since cutting back on caffeine. Heart rate increases with exercise or other physical activity. The patient has no complaints of palpitations, irregular heat beats, chest pain, or chest pressure. Gastrointestinal: She still has some postprandial bloating at times, but less frequently overall. Her IBS is better. She is  not having any other GI complaints now. The patient has no complaints of reflux, excessive hunger, upset stomach, stomach aches or other pains, diarrhea, or constipation. Legs: Muscle mass and strength seem normal. She had not had any burning and tingling of the lower half of her calves for the past 6 months or so. There are no other complaints of numbness, tingling, burning, or pain. No edema is noted. Feet: There are no obvious foot problems. There are no complaints of numbness, tingling, burning, or pain. No edema is noted. GYN: She remains on oral contraceptive pills. Her LMP was 3-4 weeks ago.   Hypoglycemia: She has not had many low BGs. When she does have low BGs, they usually occur in the later afternoon.         6. BG printout: She changes her sites every 1-3 days. She checks BGs 2-5 times per day. She boluses 3-7 times per day, usually 3-4 times. Her average BG is 218, compared with 194 at last visit and with 215 at the visit prior. BGs are quite variable, with a range of 57 to >400, compared with 51 to >400 at her last visit and with 42 to >400 at the prior visit. BGs tend to increase the longer her sites remain in place. She had 7 BGs between 57-74. She had 16 BGs between 300 - 400 and 2 BGs >400.  When her sites are working well her BGs vary from 79-174.  PAST MEDICAL, FAMILY, AND SOCIAL HISTORY:  Past Medical History:  Diagnosis Date  . Brain cyst   . DM type 1 with diabetic peripheral neuropathy (HCC)   . Goiter   . Hashimoto's disease   . Hypertension   . Hypoglycemia associated with diabetes (HCC)   . Orthostatic hypotension   . Type 1 diabetes mellitus not at goal [facility](HCC)     Family History  Problem Relation Age of Onset  . Thyroid disease Maternal Aunt     Hypothyroid, takes thyroid medicine  . Thyroid disease Maternal Grandmother     Sluggish thyroid  . Cancer Paternal Grandfather   . Diabetes Cousin     T1 DM     Current Outpatient Prescriptions:  .  Bisacodyl  (DULCOLAX PO), Take by mouth., Disp: , Rfl:  .  dicyclomine (BENTYL) 20 MG tablet, Take 20 mg by mouth every 6 (six) hours., Disp: , Rfl:  .  glucose blood (ONE TOUCH ULTRA TEST) test strip, Check sugar 10 x daily, Disp: 900 each, Rfl: 4 .  insulin aspart (NOVOLOG) 100 UNIT/ML injection, USE 300 UNITS EVERY 48 HOURS IN INSULIN PUMP, Disp: 15 vial, Rfl: 4 .  insulin lispro (HUMALOG) 100 UNIT/ML injection, Use 300 units every 48 hours in insulin pump, Disp: 15 vial, Rfl: 3 .  lisinopril (PRINIVIL,ZESTRIL) 5 MG tablet, Take 1 tablet (5 mg total) by mouth daily., Disp: 90 tablet, Rfl: 4 .  loratadine (CLARITIN) 10 MG tablet, Take 10 mg by mouth daily., Disp: , Rfl:  .  Norethin Ace-Eth Estrad-FE (LOMEDIA 24 FE PO), Take by mouth., Disp: , Rfl:   Allergies  as of 03/28/2016 - Review Complete 03/28/2016  Allergen Reaction Noted  . Zithromax [azithromycin] Hives 08/21/2010    1. Work and Family: She graduated from [employer] in 2014. 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 She is still contemplating going to grad school for a PhD in microbiology at some time in the future.  2. Activities: Victoria Atkinson has not been working out, but is trying to get back into an exercise routine.  3. Smoking, alcohol, or drugs: None 4. Primary Care Provider[facility]  REVIEW OF SYSTEMS: She is allergic to raspberries and poison ivy. There are no other significant problems involving Ova's other body systems.   Objective:  Vital Signs:  BP 121/73   Pulse 90   Wt 179 lb (81.2 kg)   BMI 28.04 kg/m  .    Ht Readings from Last 3 Encounters:  12/13/12 5\' 7"  (1.702 m)  09/16/12 5' 7.09" (1.704 m)  03/11/07 5\' 8"  (1.727 m) (94 %, Z= 1.56)*   * Growth percentiles are based on CDC 2-20 Years data.   Wt Readings from Last 3 Encounters:  03/28/16 179 lb (81.2 kg)  12/26/15 172 lb (78 kg)  09/20/15 170 lb (77.1 kg)  Facility age limit for  growth percentiles is 20 years. Facility age limit for growth percentiles is 20 years.  PHYSICAL EXAM:  Constitutional: The patient appears healthy, but more overweight. She has gained 7 pounds since last visit. Her Ideal Body Weight is 135 pounds. She is very bright, personable, and mature. Face: The face appears normal.  Eyes: There is no obvious arcus or proptosis. Moisture appears normal. Mouth: The oropharynx is normal. Her tongue is not geographic today. Oral moisture is normal. Neck: The neck appears to be visibly normal. No carotid bruits are noted. The thyroid gland is again mildly enlarged at about 21-22 grams in size. Both lobes are mildly enlarged today, essentially unchanged from her last visit. The consistency of the thyroid gland is normal. The thyroid gland is not tender to palpation. Lungs: The lungs are clear to auscultation. Air movement is good. Heart: Heart rate and rhythm are regular. Heart sounds S1 and S2 are normal. I did not appreciate any pathologic cardiac murmurs. Abdomen: The abdomen is enlarged. Bowel sounds are normal. There is no obvious hepatomegaly, splenomegaly, or other mass effect.  Arms: Muscle size and bulk are normal for age. Hands: There is no obvious tremor. Phalangeal and metacarpophalangeal joints are normal. Palmar muscles are normal. Palmar skin is normal. Palmar moisture is also normal. Legs: Muscles appear normal for age. No edema is present. Feet: Feet are normally formed. Dorsalis pedal pulses are 1+ bilaterally. She often picks at her toenails.  Neurologic: Strength is normal for age in both the upper and lower extremities. Muscle tone is normal. Sensation to touch is normal in both the legs and feet.   LAB DATA:   Labs 03/28/16: HbA1c 7.6%  Labs 12/26/15: HbA1c 8.1% today   Labs 09/20/15: HbA1c 7.6%  Labs 02/21/15: HbA1c 7.6%.   Labs 11/16/14: HbA1c 7.1%  Labs 08/04/14: HbA1c 7.5%, CMP normal except for glucose of 201; urinary  microalbumin/creatinine ratio 3.3; TSH 2.937, free T4 1.08, free T3 3.1; cholesterol 197, triglycerides 137, HDL 58, and LDL 112  Labs 1/61/099/21/15: Hemoglobin A1c was 7.9%, compared with 7.5% at last visit and with 6.9% at the visit prior.    Labs 01/25/14: HbA1c 8.4%; CMP  normal except for glucose of 178; cholesterol 225, triglycerides 276, HDL 77, LDL 92; microalbumin/creatinine ratio 2.6; TSH 2.597, free T4 1.06, free T3 2.9  Labs: 06/22/12: Cholesterol 236, triglycerides 300, HDL 68, LDL, 108 (pre-gluten-free diet); TTG IgA 6.4 (<20); IgA 152 (69-380); urinary microalbumin/creatinine ratio 3.1  Labs: 12/25/11: TSH 1.259, free T4 1.26, free T3 3.2; CMP normal          Assessment and Plan:   ASSESSMENT:  1. T1DM:  Her HbA1c is lower, but her average BGs are higher. This discrepancy indicates that her BGs 2 and 3 months ago were lower, but were higher in the past month. She needs more insulin between about midnight to 4 AM and again about 10 AM to midnight.    2. Hypoglycemia: She has had 7 BGs <80, the lowest being 57. Her lower  BGs occurred more often from 8 AM to noon and again about 10 PM.   3. Hypertension: Her BP is good today. She needs to exercise for at least 4 hours per week. 4-6. Goiter/thyroiditis/transient hypothyroidism:   A. Her thyroid gland is again mildly enlarged today. The pattern of waxing and waning of thyroid gland size is c/w evolving Hashimoto's Dz.   B. Her TFTs in March 2016 were at the low end of the normal range.  The fact that all three of her TFTs increased from September 2015 to March 2016 is pathognomonic for an interim flare up of Hashimoto's Dz. Although I have put in orders for TFTs twice since then, we have not received any results from [facility]. I asked her to contact [facility] again to send [place] the results. Our nurse contacted [facility] and was told that [facility] has no record of having performed TFTs on her.  7. Peripheral neuropathy: The neuropathy is not evident  today.  8. Dizziness/orthostatic hypotension: Her symptoms have resolved.   9-10. Autonomic neuropathy and tachycardia: Her autonomic neuropathy has improved, so her heart rate is lower.     11. Geographic tongue: Her tongue is normal today.    PLAN: 1. Diagnostic: TFTs, CMP, lipid panel, urine microalbumin/creatinine ratio. Call on the first Sunday evening in December to discuss BGs.  2. Therapeutic: Use her sensor. Continue lisinopril at 5 mg/day. Check BGs at bedtime. Resume exercising. New basal rates:  MN: 0.600 -> 0.650 4 AM: 1.10 9 AM: 0.450 11 AM: 0.650 -> 0.700 6 PM: 0.550 -> 0.600 10 PM: 0.300  3. Patient education: We discussed issues of hyperglycemia, hypoglycemia, thyroiditis, weight gain, dehydration, and hypertension at length. 4. Follow-up: 3 months  Level of Service: This visit lasted in excess of 75 minutes. More than 50% of the visit was devoted to counseling.  [name] Stall, MD, CDE Adult and Pediatric Endocrinology 03/28/2016 3:19 PM

## 2016-05-13 ENCOUNTER — Other Ambulatory Visit: Payer: Self-pay | Admitting: "Endocrinology

## 2016-05-13 DIAGNOSIS — E1065 Type 1 diabetes mellitus with hyperglycemia: Principal | ICD-10-CM

## 2016-05-13 DIAGNOSIS — IMO0001 Reserved for inherently not codable concepts without codable children: Secondary | ICD-10-CM

## 2016-06-27 ENCOUNTER — Telehealth (INDEPENDENT_AMBULATORY_CARE_PROVIDER_SITE_OTHER): Payer: Self-pay

## 2016-06-27 ENCOUNTER — Other Ambulatory Visit (INDEPENDENT_AMBULATORY_CARE_PROVIDER_SITE_OTHER): Payer: Self-pay | Admitting: "Endocrinology

## 2016-06-27 NOTE — Telephone Encounter (Signed)
  Who's calling (name and relationship to patient) :Nichoel  Best contact number:[phone]  Provider they see:  Reason for call: she was calling in tomsay she got blood draw at [facility]    PRESCRIPTION REFILL ONLY  Name of prescription:  Pharmacy:

## 2016-06-28 LAB — LIPID PANEL W/O CHOL/HDL RATIO
Cholesterol, Total: 214 mg/dL — ABNORMAL HIGH (ref 100–199)
HDL: 69 mg/dL (ref >39)
LDL CALC: 123 mg/dL — AB (ref 0–99)
TRIGLYCERIDES: 110 mg/dL (ref 0–149)
VLDL Cholesterol Cal: 22 mg/dL (ref 5–40)

## 2016-06-28 LAB — MICROALBUMIN / CREATININE URINE RATIO
CREATININE, UR: 161.9 mg/dL
Microalb/Creat Ratio: <1.9 mg/g creat (ref 0.0–30.0)
Microalbumin, Urine: <3 ug/mL

## 2016-06-28 LAB — COMPREHENSIVE METABOLIC PANEL
ALT: 16 IU/L (ref 0–32)
AST: 15 IU/L (ref 0–40)
Albumin/Globulin Ratio: 1.7 (ref 1.2–2.2)
Albumin: 4.3 g/dL (ref 3.5–5.5)
Alkaline Phosphatase: 87 IU/L (ref 39–117)
BUN / CREAT RATIO: 13 (ref 9–23)
BUN: 12 mg/dL (ref 6–20)
Bilirubin Total: 0.4 mg/dL (ref 0.0–1.2)
CHLORIDE: 95 mmol/L — AB (ref 96–106)
CO2: 26 mmol/L (ref 18–29)
CREATININE: 0.9 mg/dL (ref 0.57–1.00)
Calcium: 9.5 mg/dL (ref 8.7–10.2)
GLOBULIN, TOTAL: 2.6 g/dL (ref 1.5–4.5)
GLUCOSE: 263 mg/dL — AB (ref 65–99)
POTASSIUM: 4.4 mmol/L (ref 3.5–5.2)
Sodium: 138 mmol/L (ref 134–144)
Total Protein: 6.9 g/dL (ref 6.0–8.5)

## 2016-06-28 LAB — T4, FREE: FREE T4: 1.39 ng/dL (ref 0.82–1.77)

## 2016-06-28 LAB — TSH: TSH: 1.15 u[IU]/mL (ref 0.450–4.500)

## 2016-06-28 LAB — T3, FREE: T3, Free: 2.8 pg/mL (ref 2.0–4.4)

## 2016-07-01 ENCOUNTER — Encounter (INDEPENDENT_AMBULATORY_CARE_PROVIDER_SITE_OTHER): Payer: Self-pay | Admitting: "Endocrinology

## 2016-07-01 ENCOUNTER — Ambulatory Visit (INDEPENDENT_AMBULATORY_CARE_PROVIDER_SITE_OTHER): Payer: 59 | Admitting: "Endocrinology

## 2016-07-01 VITALS — BP 122/74 | Ht 66.89 in | Wt 175.4 lb

## 2016-07-01 DIAGNOSIS — E049 Nontoxic goiter, unspecified: Secondary | ICD-10-CM

## 2016-07-01 DIAGNOSIS — I10 Essential (primary) hypertension: Secondary | ICD-10-CM | POA: Diagnosis not present

## 2016-07-01 DIAGNOSIS — E10649 Type 1 diabetes mellitus with hypoglycemia without coma: Secondary | ICD-10-CM

## 2016-07-01 DIAGNOSIS — K141 Geographic tongue: Secondary | ICD-10-CM | POA: Diagnosis not present

## 2016-07-01 DIAGNOSIS — E1042 Type 1 diabetes mellitus with diabetic polyneuropathy: Secondary | ICD-10-CM | POA: Diagnosis not present

## 2016-07-01 DIAGNOSIS — E1065 Type 1 diabetes mellitus with hyperglycemia: Secondary | ICD-10-CM | POA: Diagnosis not present

## 2016-07-01 DIAGNOSIS — E1043 Type 1 diabetes mellitus with diabetic autonomic (poly)neuropathy: Secondary | ICD-10-CM

## 2016-07-01 DIAGNOSIS — IMO0001 Reserved for inherently not codable concepts without codable children: Secondary | ICD-10-CM

## 2016-07-01 DIAGNOSIS — R000 Tachycardia, unspecified: Secondary | ICD-10-CM | POA: Diagnosis not present

## 2016-07-01 DIAGNOSIS — I4711 Inappropriate sinus tachycardia, so stated: Secondary | ICD-10-CM

## 2016-07-01 LAB — GLUCOSE, POCT (MANUAL RESULT ENTRY): POC GLUCOSE: 128 mg/dL — AB (ref 70–99)

## 2016-07-01 LAB — POCT GLYCOSYLATED HEMOGLOBIN (HGB A1C): HEMOGLOBIN A1C: 7.2

## 2016-07-01 NOTE — Progress Notes (Signed)
Subjective:  Patient Name: Victoria Atkinson Date of Birth: 1990/12/27  MRN: [id number]  Victoria Atkinson  presents to the office today for follow-up evaluation and management of her type 1 diabetes mellitus, goiter, hypoglycemia, peripheral neuropathy, autonomic neuropathy with inappropriate sinus tachycardia, migraine headaches, Hashimoto's disease, geographic tongue, obesity, and hypertension.  HISTORY OF PRESENT ILLNESS:   [name] is a 26 y.o. Caucasian young woman.  Avalon was unaccompanied.   1. The patient was first referred to me on 10/31/04 by Dr. Crista Luria from [facility] for evaluation and management of new onset type 1 diabetes mellitus. The patient was 26 years old.  A. The patient had had about a 2 month period of polyuria, polydipsia, nocturia, upset stomach, nausea, stomach pains, and a documented 17 pound weight loss. She was often dizzy and lightheaded. At her PCP's office a serum glucose was 357. A serum CO2 was 26. I arranged to see the patient that day. 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 Family history was positive for a cousin with type 1 diabetes mellitus, a maternal grandmother with a "sluggish" thyroid, and a maternal aunt who took thyroid medicine. On physical examination her height was 65-1/2 inches. She weighed 99-1/2 pounds. Her BMI was 16.4. Her CBG was High, which was greater than 500 on our meter. Her hemoglobin A1c was 12.6%.  B. We provided diabetes education to the patient and her family in our office. I started her on a Novolog sliding scale regimen. I subsequently changed the Novolog to our two-component method with a correction dose and food dose at mealtimes. I also added Lantus insulin.  2. During the past twelve years, the patient has done fairly well overall. She had a good honeymoon period that lasted about 9 months, during which her hemoglobin A1c's varied from 6.0-6.3%. On 08/23/2005 we started her on insulin  pump therapy with a Medtronic Paradigm 722 insulin pump. Since then, her hemoglobin A1c values have varied from 6.5-8.3%. She converted to a Medtronic 530G pump in the spring of 2015.   3. On 10/21/2005, as part of an evaluation for headaches, an MRI was performed. A 3.1 x 6.2 x 11.0 cm posterior fossa arachnoid cyst was noted. Subsequent evaluation by neurosurgeons at [facility] and at [facility] felt that she had a mega cisterna magna rather than a cyst. This was considered to be a normal variant and was not felt to be causing her headaches. She was told that surgery would not be required.  4. The patient's last [facility] visit was on 03/28/16. At that time we increased three of her basal rates. She thinks that those changes were beneficial.   A. In the interim, she has been healthy, but had another bad URI soon after her last visit. She has not had any further migraines.  B. . She is still following the gluten-free diet. She still takes some non-milk dairy products.   C. She does not usually wear her Enlite CGM sensor. She wore it for about 3-1/2 weeks. When she wears the sensor she is more aware of factors that cause BG changes.   D. She uses Humalog lispro in her Medtronic 530G pump. She still takes lisinopril, 5 mg/day. She is taking her MVI as often as she remembers.    5. Pertinent Review of Systems:  Constitutional: The patient feels "good". She has been healthy.  Eyes: Vision is good with her glasses. Her last exam was  in about May 2017. She had no signs of diabetic eye disease. There are no significant eye complaints today. She will have a follow up exam later this Spring.  Mouth: She has not been bothered very often by geographic tongue symptoms recently.   Neck: The patient has not had any recent anterior neck soreness and pressure sensations.  Heart: She has not had any heart racing at rest since cutting back on caffeine. Heart rate increases with exercise or  other physical activity. The patient has no complaints of palpitations, irregular heat beats, chest pain, or chest pressure. Gastrointestinal: She still has some postprandial bloating at times, but less frequently overall. Her IBS is better. She is not having any other GI complaints now. The patient has no complaints of reflux, excessive hunger, upset stomach, stomach aches or other pains, diarrhea, or constipation. Legs: Muscle mass and strength seem normal. There are no other complaints of numbness, tingling, burning, or pain. No edema is noted. Feet: There are no obvious foot problems. There are no complaints of numbness, tingling, burning, or pain. No edema is noted. GYN: She remains on oral contraceptive pills. Her LMP was 2 weeks ago.   Hypoglycemia: She has had more low BGs when she corrects for high BGs.          6. BG printout: She changes her sites every 1-4 days. She checks BGs 3-5 times per day. She boluses 3-5 times per day, usually 3-4 times. Her average BG is 197, compared with 218 at last visit and with 194 at the visit prior. BGs are quite variable, with a range of 58->400, compared with 57 to >400  at her last visit and with 51 to >400 at the prior visit. BGs tend to increase the longer her sites remain in place. She had 6 BGs between 58-76. She had 13 BGs between 300 - 400 and 4 BGs >400.  When her sites are working well her BGs vary from 100-209.   7. Sensor printout: During the 9 days that she wore her sensor she had 7 low threshold suspends. The correlation between BGs and sensor readings was excellent.   PAST MEDICAL, FAMILY, AND SOCIAL HISTORY:  Past Medical History:  Diagnosis Date  . Brain cyst   . DM type 1 with diabetic peripheral neuropathy (HCC)   . Goiter   . Hashimoto's disease   . Hypertension   . Hypoglycemia associated with diabetes (HCC)   . Orthostatic hypotension   . Type 1 diabetes mellitus not at goal [facility](HCC)     Family History  Problem Relation Age of  Onset  . Thyroid disease Maternal Aunt     Hypothyroid, takes thyroid medicine  . Thyroid disease Maternal Grandmother     Sluggish thyroid  . Cancer Paternal Grandfather   . Diabetes Cousin     T1 DM     Current Outpatient Prescriptions:  .  Bisacodyl (DULCOLAX PO), Take by mouth., Disp: , Rfl:  .  dicyclomine (BENTYL) 20 MG tablet, Take 20 mg by mouth every 6 (six) hours., Disp: , Rfl:  .  glucose blood (ONE TOUCH ULTRA TEST) test strip, Check sugar 10 x daily, Disp: 900 each, Rfl: 4 .  insulin lispro (HUMALOG) 100 UNIT/ML injection, Use 300 units every 48 hours in insulin pump, Disp: 15 vial, Rfl: 3 .  lisinopril (PRINIVIL,ZESTRIL) 5 MG tablet, TAKE 1 TABLET BY MOUTH  DAILY, Disp: 90 tablet, Rfl: 5 .  Norethin Ace-Eth Estrad-FE (LOMEDIA 24 FE PO), Take  by mouth., Disp: , Rfl:  .  insulin aspart (NOVOLOG) 100 UNIT/ML injection, USE 300 UNITS EVERY 48 HOURS IN INSULIN PUMP (Patient not taking: Reported on 07/01/2016), Disp: 15 vial, Rfl: 4 .  loratadine (CLARITIN) 10 MG tablet, Take 10 mg by mouth daily., Disp: , Rfl:   Allergies as of 07/01/2016 - Review Complete 07/01/2016  Allergen Reaction Noted  . Zithromax [azithromycin] Hives 08/21/2010    1. Work and Family: She graduated from [employer] in 2014. She is working day shift (8 AM to 5 PM) at a [employer]. She is still contemplating going to grad school for a PhD in microbiology at some time in the future.  2. Activities: Alynn has not been working out, but is trying to get back into an exercise routine.  3. Smoking, alcohol, or drugs: None 4. Primary Care Provider: Dr. Everlene Other at [facility]  REVIEW OF SYSTEMS: She is allergic to raspberries and poison ivy. There are no other significant problems involving Shannin's other body systems.   Objective:  Vital Signs:  BP 122/74   Ht 5' 6.89" (1.699 m)   Wt 175 lb 6.4 oz (79.6 kg)   BMI 27.56 kg/m   HR 80   Ht Readings from Last 3  Encounters:  07/01/16 5' 6.89" (1.699 m)  12/13/12 5\' 7"  (1.702 m)  09/16/12 5' 7.09" (1.704 m)   Wt Readings from Last 3 Encounters:  07/01/16 175 lb 6.4 oz (79.6 kg)  03/28/16 179 lb (81.2 kg)  12/26/15 172 lb (78 kg)  Facility age limit for growth percentiles is 20 years. Facility age limit for growth percentiles is 20 years.  PHYSICAL EXAM:  Constitutional: The patient appears healthy and less overweight. She has lost almost 4 pounds since last visit. Her Ideal Body Weight is 135 pounds. She is very bright, personable, and mature. Face: The face appears normal.  Eyes: There is no obvious arcus or proptosis. Moisture appears normal. Mouth: The oropharynx is normal. Her tongue is not geographic today. Oral moisture is normal. Neck: The neck appears to be visibly normal. No carotid bruits are noted. The thyroid gland is slightly smaller today at about 21+ grams in size. Both lobes are mildly enlarged today, but smaller.  The consistency of the thyroid gland is normal. The thyroid gland is not tender to palpation. Lungs: The lungs are clear to auscultation. Air movement is good. Heart: Heart rate and rhythm are regular. Heart sounds S1 and S2 are normal. I did not appreciate any pathologic cardiac murmurs. Abdomen: The abdomen is enlarged. Bowel sounds are normal. There is no obvious hepatomegaly, splenomegaly, or other mass effect.  Arms: Muscle size and bulk are normal for age. Hands: There is no obvious tremor. Phalangeal and metacarpophalangeal joints are normal. Palmar muscles are normal. Palmar skin is normal. Palmar moisture is also normal. Legs: Muscles appear normal for age. No edema is present. Feet: Feet are normally formed. Dorsalis pedal pulses are 1+ on the right and faint 1+ on the left.  Neurologic: Strength is normal for age in both the upper and lower extremities. Muscle tone is normal. Sensation to touch is normal in both the legs and feet.   LAB DATA:   Labs 07/01/16:  HbA1c 7.2%  Labs 06/27/16: Pending from [facility]  Labs 03/28/16: HbA1c 7.6%  Labs 12/26/15: HbA1c 8.1% today   Labs 09/20/15: HbA1c 7.6%  Labs 02/21/15: HbA1c 7.6%.   Labs 11/16/14: HbA1c 7.1%  Labs 08/04/14: HbA1c 7.5%,  CMP normal except for glucose of 201; urinary microalbumin/creatinine ratio 3.3; TSH 2.937, free T4 1.08, free T3 3.1; cholesterol 197, triglycerides 137, HDL 58, and LDL 112  Labs 08/20/79: Hemoglobin A1c was 7.9%, compared with 7.5% at last visit and with 6.9% at the visit prior.    Labs 01/25/14: HbA1c 8.4%; CMP normal except for glucose of 178; cholesterol 225, triglycerides 276, HDL 77, LDL 92; microalbumin/creatinine ratio 2.6; TSH 2.597, free T4 1.06, free T3 2.9  Labs: 06/22/12: Cholesterol 236, triglycerides 300, HDL 68, LDL, 108 (pre-gluten-free diet); TTG IgA 6.4 (<20); IgA 152 (69-380); urinary microalbumin/creatinine ratio 3.1  Labs: 12/25/11: TSH 1.259, free T4 1.26, free T3 3.2; CMP normal          Assessment and Plan:   ASSESSMENT:  1. T1DM:  Her HbA1c is lower and her average BGs are lower. She is having fewer high BGs and fewer low BGs.  2. Hypoglycemia: She has had 6 BGs <80, the lowest being 58. Her lower  BGs occurred more often in the early morning hours, mid-afternoons, and in the evenings. She needs less insulin between midnight and 4 AM. 3. Hypertension: Her BP is good today. She needs to exercise for at least 4 hours per week. 4-6. Goiter/thyroiditis/transient hypothyroidism:   A. Her thyroid gland is again mildly enlarged today, but a bit smaller. The pattern of waxing and waning of thyroid gland size is c/w evolving Hashimoto's Dz.   B. Her TFTs in March 2016 were at the low end of the normal range.  The fact that all three of her TFTs increased from September 2015 to March 2016 is pathognomonic for an interim flare up of Hashimoto's Dz. Although I have put in orders for TFTs twice since then, we have not received any results from [facility]. She had  another set of labs drawn last week. She will fax the results to [place].   7. Peripheral neuropathy: The neuropathy is not evident today.  8. Dizziness/orthostatic hypotension: Her symptoms have resolved.   9-10. Autonomic neuropathy and tachycardia: Her autonomic neuropathy has improved, so her heart rate is lower.     11. Geographic tongue: Her tongue is mildly geographic today.     PLAN: 1. Diagnostic: Await the results of her TFTs, CMP, lipid panel, urine microalbumin/creatinine ratio. 2. Therapeutic: Use her sensor. Continue lisinopril at 5 mg/day. Check BGs at bedtime. Resume exercising.  New basal rate:  MN: 0.650 -> 0.600 4 AM: 1.10 9 AM: 0.450 11 AM: 0.700 6 PM: 0.600 10 PM: 0.300  3. Patient education: We discussed issues of hyperglycemia, hypoglycemia, thyroiditis, weight gain, dehydration, and hypertension at length. 4. Follow-up: 3 months  Level of Service: This visit lasted in excess of 75 minutes. More than 50% of the visit was devoted to counseling.  Molli Knock, MD, CDE Adult and Pediatric Endocrinology 07/01/2016 3:41 PM

## 2016-07-01 NOTE — Patient Instructions (Signed)
Follow up visit in 3 months. 

## 2016-07-15 ENCOUNTER — Encounter (INDEPENDENT_AMBULATORY_CARE_PROVIDER_SITE_OTHER): Payer: Self-pay | Admitting: *Deleted

## 2016-08-31 ENCOUNTER — Other Ambulatory Visit (INDEPENDENT_AMBULATORY_CARE_PROVIDER_SITE_OTHER): Payer: Self-pay | Admitting: "Endocrinology

## 2016-08-31 DIAGNOSIS — E1065 Type 1 diabetes mellitus with hyperglycemia: Principal | ICD-10-CM

## 2016-08-31 DIAGNOSIS — IMO0001 Reserved for inherently not codable concepts without codable children: Secondary | ICD-10-CM

## 2016-10-09 ENCOUNTER — Ambulatory Visit (INDEPENDENT_AMBULATORY_CARE_PROVIDER_SITE_OTHER): Payer: 59 | Admitting: "Endocrinology

## 2016-10-09 VITALS — BP 110/72 | HR 76 | Ht 67.0 in | Wt 177.6 lb

## 2016-10-09 DIAGNOSIS — E1043 Type 1 diabetes mellitus with diabetic autonomic (poly)neuropathy: Secondary | ICD-10-CM | POA: Diagnosis not present

## 2016-10-09 DIAGNOSIS — I4711 Inappropriate sinus tachycardia, so stated: Secondary | ICD-10-CM

## 2016-10-09 DIAGNOSIS — I10 Essential (primary) hypertension: Secondary | ICD-10-CM

## 2016-10-09 DIAGNOSIS — E7800 Pure hypercholesterolemia, unspecified: Secondary | ICD-10-CM | POA: Diagnosis not present

## 2016-10-09 DIAGNOSIS — E063 Autoimmune thyroiditis: Secondary | ICD-10-CM | POA: Diagnosis not present

## 2016-10-09 DIAGNOSIS — E049 Nontoxic goiter, unspecified: Secondary | ICD-10-CM | POA: Diagnosis not present

## 2016-10-09 DIAGNOSIS — E1065 Type 1 diabetes mellitus with hyperglycemia: Secondary | ICD-10-CM

## 2016-10-09 DIAGNOSIS — K141 Geographic tongue: Secondary | ICD-10-CM

## 2016-10-09 DIAGNOSIS — R000 Tachycardia, unspecified: Secondary | ICD-10-CM | POA: Diagnosis not present

## 2016-10-09 DIAGNOSIS — E11649 Type 2 diabetes mellitus with hypoglycemia without coma: Secondary | ICD-10-CM | POA: Diagnosis not present

## 2016-10-09 DIAGNOSIS — E1042 Type 1 diabetes mellitus with diabetic polyneuropathy: Secondary | ICD-10-CM

## 2016-10-09 DIAGNOSIS — IMO0001 Reserved for inherently not codable concepts without codable children: Secondary | ICD-10-CM

## 2016-10-09 LAB — POCT GLUCOSE (DEVICE FOR HOME USE): POC GLUCOSE: 61 mg/dL — AB (ref 70–99)

## 2016-10-09 LAB — POCT GLYCOSYLATED HEMOGLOBIN (HGB A1C): HEMOGLOBIN A1C: 7.1

## 2016-10-09 NOTE — Patient Instructions (Signed)
Follow up visit in 3 months. Please call Dr. Fransico MichaelBrennan in 2-3 weeks on a Wednesday or Sunday evening between 8:00-9:30 PM.

## 2016-10-09 NOTE — Progress Notes (Signed)
Subjective:  Patient Name: Victoria Atkinson Date of Birth: 1991/03/23  MRN: [id number]  Victoria Atkinson  presents to the office today for follow-up evaluation and management of her type 1 diabetes mellitus, goiter, hypoglycemia, peripheral neuropathy, autonomic neuropathy with inappropriate sinus tachycardia, migraine headaches, Hashimoto's disease, geographic tongue, obesity, and hypertension.  HISTORY OF PRESENT ILLNESS:   Victoria Atkinson is a 26 y.o. Caucasian young woman.  Victoria Atkinson was unaccompanied.   1. The patient was first referred to me on 10/31/04 by Dr. Crista Luria from [facility] for evaluation and management of new onset type 1 diabetes mellitus. The patient was 26 years old.  A. The patient had had about a 2 month period of polyuria, polydipsia, nocturia, upset stomach, nausea, stomach pains, and a documented 17 pound weight loss. She was often dizzy and lightheaded. At her PCP's office a serum glucose was 357. A serum CO2 was 26. I arranged to see the patient that day. 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 Family history was positive for a cousin with type 1 diabetes mellitus, a maternal grandmother with a "sluggish" thyroid, and a maternal aunt who took thyroid medicine. On physical examination her height was 65-1/2 inches. She weighed 99-1/2 pounds. Her BMI was 16.4. Her CBG was High, which was greater than 500 on our meter. Her hemoglobin A1c was 12.6%.  B. We provided diabetes education to the patient and her family in our office. I started her on a Novolog sliding scale regimen. I subsequently changed the Novolog to our two-component method with a correction dose and food dose at mealtimes. I also added Lantus insulin.  2. During the past twelve years, the patient has done fairly well overall. She had a good honeymoon period that lasted about 9 months, during which her hemoglobin A1c's varied from 6.0-6.3%. On 08/23/2005 we started her on insulin  pump therapy with a Medtronic Paradigm 722 insulin pump. Since then, her hemoglobin A1c values have varied from 6.5-8.3%. She converted to a Medtronic 530G pump in the spring of 2015.   3. On 10/21/2005, as part of an evaluation for headaches, an MRI was performed. A 3.1 x 6.2 x 11.0 cm posterior fossa arachnoid cyst was noted. Subsequent evaluation by neurosurgeons at [facility] and at [facility] felt that she had a mega cisterna magna rather than a cyst. This was considered to be a normal variant and was not felt to be causing her headaches. She was told that surgery would not be required.  4. The patient's last [facility] visit was on 07/01/16. At that time we decreased her midnight basal rate. She thinks that change may have helped.   A. In the interim, she has been healthy, but had the flu in late February or early March. She has not had any further migraines, but has had some frontal headaches after changing her eyeglass prescription.   B. She is still following the gluten-free and diary-free diet. She still takes some non-milk dairy products.   C. She does not wear her Enlite CGM sensor. She just can't remember to put it on.   D. She uses Humalog lispro in her Medtronic 530G pump. She still takes lisinopril, 5 mg/day. She is taking her MVI as often as she remembers, which is not often.   E. She does not usually carry glucose tabs with her or have them at home, so when she drops low she crams in whatever she can. The  excess carb intake then causes elevated BGs 2-5 hours later.    5. Pertinent Review of Systems:  Constitutional: The patient feels "good". She has been healthy. Her allergies are acting up now.  Eyes: Vision is good with her glasses. Her last exam was last week in May 2018. She had no signs of diabetic eye disease. There are no significant eye complaints today.  Mouth: She has not been bothered very often by geographic tongue symptoms recently.   Neck: The  patient has not had any recent anterior neck soreness and pressure sensations.  Heart: She has not had any heart racing at rest despite resuming caffeine use. Heart rate increases with exercise or other physical activity. The patient has no complaints of palpitations, irregular heat beats, chest pain, or chest pressure. Gastrointestinal: She still has some postprandial bloating at times, but "not too much". Her IBS is "okay"  As long as she avoids dairy. Unfortunately, she likes her cheese. She is not having any other GI complaints now. The patient has no complaints of reflux, excessive hunger, upset stomach, stomach aches or other pains, diarrhea, or constipation. Legs: Muscle mass and strength seem normal. There are no complaints of numbness, tingling, burning, or pain. No edema is noted. Feet: There are no obvious foot problems. There are no complaints of numbness, tingling, burning, or pain. No edema is noted. GYN: She remains on oral contraceptive pills. Her LMP was 4 weeks ago.   Hypoglycemia: She has had low BGs in the first 24 hours after changing pump sites at times.           6. BG printout: She changes her sites every 1-3 days. She checks BGs 2-5 times per day. She boluses 3-5 times per day, usually 3-4 times. Her average BG is 192, compared with 197 at last visit and with 218 at the visit prior. BGs are quite variable, with a range of <40 to >400, compared with 58->400 at her last visit and with 57- >400  at her prior visit. She is doing a much better job of changing her sites. She had 10 BGs <80, compared with 6 at her last visit. All of her low BGs occurred between 11 AM-4 PM. She had 10 BGs between 300-400 and 3 BGs >400. Many of her higher BGs were due to overtreatment of low BGs.  When her sites are working well her BGs vary from 80-186.    7. Sensor printout: None   PAST MEDICAL, FAMILY, AND SOCIAL HISTORY:  Past Medical History:  Diagnosis Date  . Brain cyst   . DM type 1 with  diabetic peripheral neuropathy (HCC)   . Goiter   . Hashimoto's disease   . Hypertension   . Hypoglycemia associated with diabetes (HCC)   . Orthostatic hypotension   . Type 1 diabetes mellitus not at goal [facility](HCC)     Family History  Problem Relation Age of Onset  . Thyroid disease Maternal Aunt        Hypothyroid, takes thyroid medicine  . Thyroid disease Maternal Grandmother        Sluggish thyroid  . Cancer Paternal Grandfather   . Diabetes Cousin        T1 DM     Current Outpatient Prescriptions:  .  Bisacodyl (DULCOLAX PO), Take by mouth., Disp: , Rfl:  .  dicyclomine (BENTYL) 20 MG tablet, Take 20 mg by mouth every 6 (six) hours., Disp: , Rfl:  .  glucose blood (ONE TOUCH ULTRA  TEST) test strip, Check sugar 10 x daily, Disp: 900 each, Rfl: 4 .  HUMALOG 100 UNIT/ML injection, USE 300 UNITS EVERY 48  HOURS IN INSULIN PUMP, Disp: 140 mL, Rfl: 4 .  insulin aspart (NOVOLOG) 100 UNIT/ML injection, USE 300 UNITS EVERY 48 HOURS IN INSULIN PUMP, Disp: 15 vial, Rfl: 4 .  lisinopril (PRINIVIL,ZESTRIL) 5 MG tablet, TAKE 1 TABLET BY MOUTH  DAILY, Disp: 90 tablet, Rfl: 5 .  loratadine (CLARITIN) 10 MG tablet, Take 10 mg by mouth daily., Disp: , Rfl:  .  Norethin Ace-Eth Estrad-FE (LOMEDIA 24 FE PO), Take by mouth., Disp: , Rfl:   Allergies as of 10/09/2016 - Review Complete 07/01/2016  Allergen Reaction Noted  . Zithromax [azithromycin] Hives 08/21/2010    1. Work and Family: She graduated from [employer] in 2014. 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 She is still contemplating going to grad school for a PhD in microbiology at some time in the future.  2. Activities: Victoria Atkinson plays tennis 2-3 times per week, usually about 6 PM, before dinner.   3. Smoking, alcohol, or drugs: None 4. Primary Care Provider: Dr. Everlene Other at [facility]  REVIEW OF SYSTEMS: She is allergic to raspberries and poison ivy. There are no other  significant problems involving Victoria Atkinson's other body systems.   Objective:  Vital Signs:  BP 110/72   Pulse 76   Ht 5\' 7"  (1.702 m)   Wt 177 lb 9.6 oz (80.6 kg)   BMI 27.82 kg/m   HR 80   Ht Readings from Last 3 Encounters:  10/09/16 5\' 7"  (1.702 m)  07/01/16 5' 6.89" (1.699 m)  12/13/12 5\' 7"  (1.702 m)   Wt Readings from Last 3 Encounters:  10/09/16 177 lb 9.6 oz (80.6 kg)  07/01/16 175 lb 6.4 oz (79.6 kg)  03/28/16 179 lb (81.2 kg)  Facility age limit for growth percentiles is 20 years. Facility age limit for growth percentiles is 20 years.  PHYSICAL EXAM:  Constitutional: The patient appears healthy and less overweight. She has re-gained 2 pounds since last visit. Her Ideal Body Weight is 135 pounds. She is very bright, personable, and mature. Face: The face appears normal.  Eyes: There is no obvious arcus or proptosis. Moisture appears normal. Mouth: The oropharynx is normal. Her tongue is mildly geographic today. Oral moisture is normal. Neck: The neck appears to be visibly normal. No carotid bruits are noted. The thyroid gland is slightly larger at about 21-22 grams in size. The left lobe and isthmus are more enlarged, but the right lobe has shrunk back almost to normal. The consistency of the thyroid gland is normal. The thyroid gland is not tender to palpation. Lungs: The lungs are clear to auscultation. Air movement is good. Heart: Heart rate and rhythm are regular. Heart sounds S1 and S2 are normal. I did not appreciate any pathologic cardiac murmurs. Abdomen: The abdomen is enlarged. Bowel sounds are normal. There is no obvious hepatomegaly, splenomegaly, or other mass effect.  Arms: Muscle size and bulk are normal for age. Hands: There is no obvious tremor. Phalangeal and metacarpophalangeal joints are normal. Palmar muscles are normal. Palmar skin is normal. Palmar moisture is also normal. Legs: Muscles appear normal for age. No edema is present. Feet: Feet are  normally formed. Dorsalis pedal pulses are 1+ on the right and faint 1+ on the left.  Neurologic: Strength is normal for age in both  the upper and lower extremities. Muscle tone is normal. Sensation to touch is normal in both the legs and feet.   LAB DATA:   Labs 10/09/16: HbA1c 7.1%, CBG 61 prior to a snack  Labs 07/01/16: HbA1c 7.2%  Labs 06/27/16: TSH 1.15, free T4 1.39, free T3 2.8; CMP normal except for glucose 263; cholesterol 214, triglycerides 110, HDL 69. LDL 123; microalbumin/creatinine ratio <1.9  Labs 03/28/16: HbA1c 7.6%  Labs 12/26/15: HbA1c 8.1% today   Labs 09/20/15: HbA1c 7.6%  Labs 02/21/15: HbA1c 7.6%.   Labs 11/16/14: HbA1c 7.1%  Labs 08/04/14: HbA1c 7.5%, CMP normal except for glucose of 201; urinary microalbumin/creatinine ratio 3.3; TSH 2.937, free T4 1.08, free T3 3.1; cholesterol 197, triglycerides 137, HDL 58, and LDL 112  Labs 9/62/95: Hemoglobin A1c was 7.9%, compared with 7.5% at last visit and with 6.9% at the visit prior.    Labs 01/25/14: HbA1c 8.4%; CMP normal except for glucose of 178; cholesterol 225, triglycerides 276, HDL 77, LDL 92; microalbumin/creatinine ratio 2.6; TSH 2.597, free T4 1.06, free T3 2.9  Labs: 06/22/12: Cholesterol 236, triglycerides 300, HDL 68, LDL, 108 (pre-gluten-free diet); TTG IgA 6.4 (<20); IgA 152 (69-380); urinary microalbumin/creatinine ratio 3.1  Labs: 12/25/11: TSH 1.259, free T4 1.26, free T3 3.2; CMP normal          Assessment and Plan:   ASSESSMENT:  1. T1DM:  Her HbA1c is lower and her average BGs are lower, but at the cost of having more low BGs and then more higher BGs due to overtreatment.   2. Hypoglycemia: She has had 10 BGs <80, the lowest being <40. Her low  BGs occurred during the period 11 AM - 4 PM, most often on the second day after having changed her sites. She needs less insulin during those times.  3. Hypertension: Her BP is good today. She needs to exercise for at least 4 hours per week. 4-6.  Goiter/thyroiditis/transient hypothyroidism:   A. Her thyroid gland is again mildly enlarged today, but the lobes have shifted in size. The pattern of waxing and waning of thyroid gland size is c/w evolving Hashimoto's Dz.   B. Her TFTs in March 2016 were at the low end of the normal range.  The fact that all three of her TFTs increased from September 2015 to March 2016 is pathognomonic for an interim flare up of Hashimoto's Dz. Her TFTS in February 2018 were well within the goal TSH range of 1.0-2.0.    7. Peripheral neuropathy: The neuropathy is not evident today.  8. Dizziness/orthostatic hypotension: Her symptoms have resolved.   9-10. Autonomic neuropathy and tachycardia: Her autonomic neuropathy has improved, so her heart rate is lower.     11. Geographic tongue: Her tongue is mildly geographic today.  She needs to take her MVI daily.  12. Hypercholesterolemia: We need to repeat her fasting lipids. We may need to start her on a statin soon.    PLAN: 1. Diagnostic: We reviewed her lab results from February and her HbA1c from today. Call in two weeks on a Wednesday or Sunday evening to discuss BGs. Fasting lipid level prior to next visit.  2. Therapeutic: Use her sensor. Continue lisinopril at 5 mg/day. Check BGs at bedtime. Resume exercising.  New basal rate:  MN: 600 -> 0.625 4 AM: 1.10 9 AM: 0.450 -> 0.400 11 AM: 0.700 -> 0.650 6 PM: 0.600 10 PM: 0.300  3. Patient education: We discussed issues of hyperglycemia, hypoglycemia, thyroiditis, weight gain, dehydration,  and hypertension at length. 4. Follow-up: 3 months  Level of Service: This visit lasted in excess of 70 minutes. More than 50% of the visit was devoted to counseling.  Molli Knock, MD, CDE Adult and Pediatric Endocrinology 10/09/2016 4:40 PM

## 2016-10-10 ENCOUNTER — Encounter (INDEPENDENT_AMBULATORY_CARE_PROVIDER_SITE_OTHER): Payer: Self-pay | Admitting: "Endocrinology

## 2017-01-22 ENCOUNTER — Ambulatory Visit (INDEPENDENT_AMBULATORY_CARE_PROVIDER_SITE_OTHER): Payer: 59 | Admitting: "Endocrinology

## 2017-02-17 ENCOUNTER — Ambulatory Visit (INDEPENDENT_AMBULATORY_CARE_PROVIDER_SITE_OTHER): Payer: 59 | Admitting: "Endocrinology

## 2017-02-17 ENCOUNTER — Encounter (INDEPENDENT_AMBULATORY_CARE_PROVIDER_SITE_OTHER): Payer: Self-pay | Admitting: "Endocrinology

## 2017-02-17 VITALS — BP 122/70 | HR 78 | Wt 178.0 lb

## 2017-02-17 DIAGNOSIS — E663 Overweight: Secondary | ICD-10-CM | POA: Diagnosis not present

## 2017-02-17 DIAGNOSIS — E063 Autoimmune thyroiditis: Secondary | ICD-10-CM

## 2017-02-17 DIAGNOSIS — I951 Orthostatic hypotension: Secondary | ICD-10-CM | POA: Diagnosis not present

## 2017-02-17 DIAGNOSIS — E1043 Type 1 diabetes mellitus with diabetic autonomic (poly)neuropathy: Secondary | ICD-10-CM | POA: Diagnosis not present

## 2017-02-17 DIAGNOSIS — I10 Essential (primary) hypertension: Secondary | ICD-10-CM

## 2017-02-17 DIAGNOSIS — E7800 Pure hypercholesterolemia, unspecified: Secondary | ICD-10-CM

## 2017-02-17 DIAGNOSIS — E049 Nontoxic goiter, unspecified: Secondary | ICD-10-CM

## 2017-02-17 DIAGNOSIS — E1065 Type 1 diabetes mellitus with hyperglycemia: Secondary | ICD-10-CM

## 2017-02-17 DIAGNOSIS — K141 Geographic tongue: Secondary | ICD-10-CM

## 2017-02-17 DIAGNOSIS — IMO0001 Reserved for inherently not codable concepts without codable children: Secondary | ICD-10-CM

## 2017-02-17 DIAGNOSIS — R000 Tachycardia, unspecified: Secondary | ICD-10-CM

## 2017-02-17 DIAGNOSIS — E1042 Type 1 diabetes mellitus with diabetic polyneuropathy: Secondary | ICD-10-CM | POA: Diagnosis not present

## 2017-02-17 DIAGNOSIS — E10649 Type 1 diabetes mellitus with hypoglycemia without coma: Secondary | ICD-10-CM

## 2017-02-17 LAB — POCT GLYCOSYLATED HEMOGLOBIN (HGB A1C): HEMOGLOBIN A1C: 7.2

## 2017-02-17 LAB — POCT GLUCOSE (DEVICE FOR HOME USE): POC Glucose: 155 mg/dl — AB (ref 70–99)

## 2017-02-17 NOTE — Patient Instructions (Signed)
Follow up visit in 3 months. Please call Dr. Fransico Michael on a Wednesday or Sunday evening, between 8:00-9:30 PM to discuss BGs if needed, except from 03/02/17-03/07/17.

## 2017-02-17 NOTE — Progress Notes (Signed)
Subjective:  Patient Name: Victoria Atkinson Date of Birth: [birth year]  MRN: [id number]  Victoria Atkinson  presents to the office today for follow-up evaluation and management of her type 1 diabetes mellitus, goiter, hypoglycemia, peripheral neuropathy, autonomic neuropathy with inappropriate sinus tachycardia, migraine headaches, goiter, Hashimoto's disease, geographic tongue, obesity, and hypertension.  HISTORY OF PRESENT ILLNESS:   Victoria Atkinson is a 26 y.o. Caucasian young woman.  Jet was unaccompanied.   1. The patient was first referred to me on 10/31/04 by Dr. Crista Luria from [facility] for evaluation and management of new-onset type 1 diabetes mellitus. The patient was 26 years old.  A. The patient had had about a 2 month period of polyuria, polydipsia, nocturia, upset stomach, nausea, stomach pains, and a documented 17 pound weight loss. She was often dizzy and lightheaded. At her PCP's office a serum glucose was 357. A serum CO2 was 26. I arranged to see the patient that day. 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 Family history was positive for a cousin with type 1 diabetes mellitus, a maternal grandmother with a "sluggish" thyroid, and a maternal aunt who took thyroid medicine. On physical examination her height was 65-1/2 inches. She weighed 99-1/2 pounds. Her BMI was 16.4. Her CBG was High, which was greater than 500 on our meter. Her hemoglobin A1c was 12.6%.  B. We provided diabetes education to the patient and her family in our office. I started her on a Novolog sliding scale regimen. I subsequently changed the Novolog to our two-component method with a correction dose and food dose at mealtimes. I also added Lantus insulin.  2. During the past twelve years, the patient has done fairly well overall. She had a good honeymoon period that lasted about 9 months, during which her hemoglobin A1c's varied from 6.0-6.3%. On 08/23/2005 we started her on  insulin pump therapy with a Medtronic Paradigm 722 insulin pump. Since then, her hemoglobin A1c values have varied from 6.5-8.3%. She converted to a Medtronic 530G pump in the spring of 2015.   3. On 10/21/2005, as part of an evaluation for headaches, an MRI was performed. A 3.1 x 6.2 x 11.0 cm posterior fossa arachnoid cyst was noted. Subsequent evaluation by neurosurgeons at [facility] and at [facility] felt that she had a mega cisterna magna rather than a cyst. This was considered to be a normal variant and was not felt to be causing her headaches. She was told that surgery would not be required.  4. The patient's last [facility] visit was on 10/09/16. At that time we changed several basal rates. She thinks those changes may have helped.    A. In the interim, she has been healthy. She has not had any further migraines. Her frontal headaches also got better.    B. She is still following the gluten-free and dairy-free diet. She still takes some non-milk dairy products.   C. She does not wear her Enlite CGM sensor. She just can't remember to put it on.   D. She uses Humalog lispro in her Medtronic 530G pump. She still takes lisinopril, 5 mg/day. She resumed taking her MVI recently.    E. She does not usually carry glucose tabs with her or have them at home, so when she drops low she takes whatever glucose that she can, usually juices and soft drinks. If she takes too many carbs, she then has elevated BGs 2-5 hours later.  5. Pertinent Review of Systems:  Constitutional: The patient feels "pretty good". She has been healthy. Her allergies are acting up a bit.  Eyes: Vision is good with her glasses. Her last exam was in May 2018. She had no signs of diabetic eye disease. There are no significant eye complaints today.  Mouth: Her geographic tongue cleared up.  Neck: The patient has not had any recent anterior neck soreness and pressure sensations.  Heart: She has not had any  heart racing at rest despite resuming caffeine use. Heart rate increases with exercise or other physical activity. The patient has no complaints of palpitations, irregular heat beats, chest pain, or chest pressure. Gastrointestinal: She still has a little postprandial bloating at times, but "not a lot recently". Her IBS is "kinda there" and she has been having more diarrhea. She does pretty well as long as she avoids dairy. Unfortunately, she still likes her cheese. She has no complaints of reflux, excessive hunger, upset stomach, stomach aches or other pains, or constipation. Legs: Muscle mass and strength seem normal. There are no complaints of numbness, tingling, burning, or pain. No edema is noted. Feet: There are no obvious foot problems. There are no complaints of numbness, tingling, burning, or pain. No edema is noted. GYN: She remains on oral contraceptive pills. Her LMP was 2 weeks ago.   Hypoglycemia: She has not had many low BGs. When they occur, they tend to occur in the first 24 hours after changing pump sites at times.        6. BG printout: She changes her sites every 1-3 days. She checks BGs 2-5 times per day. She boluses 3-5 times per day, usually 3-4 times. Her average BG is 192, compared with 192 at last visit and with 197 at the visit prior. BGs are quite variable, with a range of 41 - >400, compared with <40 - >400 at her last visit and with 58 - >400  at her prior visit. She is doing a good job of changing her sites. She had 16 BGs <80, compared with 10 at her last visit. Almost all of her low BGs occurred between 12PM-3 PM. She had 13 BGs between 300-400 and 3 BGs >400. Many of her higher BGs were during the night if she did not check her BG at bedtime the night before, due to bad sites, due to overtreatment of low BGs, or due to failure to bolus.  When her sites are working well her BGs vary from 70-175.    7. Sensor printout: None   PAST MEDICAL, FAMILY, AND SOCIAL  HISTORY:  Past Medical History:  Diagnosis Date  . Brain cyst   . DM type 1 with diabetic peripheral neuropathy (HCC)   . Goiter   . Hashimoto's disease   . Hypertension   . Hypoglycemia associated with diabetes (HCC)   . Orthostatic hypotension   . Type 1 diabetes mellitus not at goal [facility])     Family History  Problem Relation Age of Onset  . Thyroid disease Maternal Aunt        Hypothyroid, takes thyroid medicine  . Thyroid disease Maternal Grandmother        Sluggish thyroid  . Cancer Paternal Grandfather   . Diabetes Cousin        T1 DM     Current Outpatient Prescriptions:  .  glucose blood (ONE TOUCH ULTRA TEST) test strip, Check sugar 10 x daily, Disp: 900 each, Rfl: 4 .  HUMALOG 100  UNIT/ML injection, USE 300 UNITS EVERY 48  HOURS IN INSULIN PUMP, Disp: 140 mL, Rfl: 4 .  lisinopril (PRINIVIL,ZESTRIL) 5 MG tablet, TAKE 1 TABLET BY MOUTH  DAILY, Disp: 90 tablet, Rfl: 5 .  loratadine (CLARITIN) 10 MG tablet, Take 10 mg by mouth daily., Disp: , Rfl:  .  Norethin Ace-Eth Estrad-FE (LOMEDIA 24 FE PO), Take by mouth., Disp: , Rfl:  .  Bisacodyl (DULCOLAX PO), Take by mouth., Disp: , Rfl:  .  dicyclomine (BENTYL) 20 MG tablet, Take 20 mg by mouth every 6 (six) hours., Disp: , Rfl:   Allergies as of 02/17/2017 - Review Complete 02/17/2017  Allergen Reaction Noted  . Zithromax [azithromycin] Hives 08/21/2010    1. Work and Family: She graduated from [employer] in 2014. 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 She is still contemplating going to grad school for a PhD in microbiology at some time in the future.  2. Activities: She has resumed playing oboe in an orchestra. She is trying to work less and do more things that she enjoys. She is trying to achieve a better balance and quality of life. Erleen has not been physically active.    3. Smoking, alcohol, or drugs: None 4. Primary Care Provider: Dr. Everlene Other at [facility] at  [facility]  REVIEW OF SYSTEMS: She is allergic to raspberries and poison ivy. There are no other significant problems involving Mychal's other body systems.   Objective:  Vital Signs:  BP 122/70   Pulse 78   Wt 178 lb (80.7 kg)   BMI 27.88 kg/m   HR 80   Ht Readings from Last 3 Encounters:  10/09/16 5\' 7"  (1.702 m)  07/01/16 5' 6.89" (1.699 m)  12/13/12 5\' 7"  (1.702 m)   Wt Readings from Last 3 Encounters:  02/17/17 178 lb (80.7 kg)  10/09/16 177 lb 9.6 oz (80.6 kg)  07/01/16 175 lb 6.4 oz (79.6 kg)  Facility age limit for growth percentiles is 20 years. Facility age limit for growth percentiles is 20 years.  PHYSICAL EXAM:  Constitutional: The patient appears healthy, but overweight. She has re-gained 1/2 pound since last visit. Her Ideal Body Weight is 135 pounds. She is very bright, personable, and mature. She does not have scale at home and does not like to weigh herself. Face: The face appears normal.  Eyes: There is no obvious arcus or proptosis. Moisture appears normal. Mouth: The oropharynx is normal. Her tongue is normal today. Oral moisture is normal. Neck: The neck appears to be visibly normal. No carotid bruits are noted. The thyroid gland is slightly smaller at about 21 grams in size. The lobes are symmetrically enlarged today, in comparison with her last visit when the left lobe and isthmus were more enlarged, but the right lobe had shrunk back almost to normal. The consistency of the thyroid gland is normal. The thyroid gland is not tender to palpation. Lungs: The lungs are clear to auscultation. Air movement is good. Heart: Heart rate and rhythm are regular. Heart sounds S1 and S2 are normal. I did not appreciate any pathologic cardiac murmurs. Abdomen: The abdomen is enlarged. Bowel sounds are normal. There is no obvious hepatomegaly, splenomegaly, or other mass effect.  Arms: Muscle size and bulk are normal for age. Hands: There is no obvious tremor. Phalangeal  and metacarpophalangeal joints are normal. Palmar muscles are normal. Palmar skin is normal. Palmar moisture is also normal. Legs: Muscles  appear normal for age. No edema is present. Feet: Feet are normally formed. Dorsalis pedal pulses are 1+ bilaterally today.   Neurologic: Strength is normal for age in both the upper and lower extremities. Muscle tone is normal. Sensation to touch is normal in both the legs and feet.   LAB DATA:   Labs 02/17/17: HbA1c 7.2%, CBG 155  Labs 10/09/16: HbA1c 7.1%, CBG 61 prior to a snack  Labs 07/01/16: HbA1c 7.2%  Labs 06/27/16: TSH 1.15, free T4 1.39, free T3 2.8; CMP normal except for glucose 263; cholesterol 214, triglycerides 110, HDL 69. LDL 123; microalbumin/creatinine ratio <1.9  Labs 03/28/16: HbA1c 7.6%  Labs 12/26/15: HbA1c 8.1% today   Labs 09/20/15: HbA1c 7.6%  Labs 02/21/15: HbA1c 7.6%.   Labs 11/16/14: HbA1c 7.1%  Labs 08/04/14: HbA1c 7.5%, CMP normal except for glucose of 201; urinary microalbumin/creatinine ratio 3.3; TSH 2.937, free T4 1.08, free T3 3.1; cholesterol 197, triglycerides 137, HDL 58, and LDL 112  Labs 1/61/09: Hemoglobin A1c was 7.9%, compared with 7.5% at last visit and with 6.9% at the visit prior.    Labs 01/25/14: HbA1c 8.4%; CMP normal except for glucose of 178; cholesterol 225, triglycerides 276, HDL 77, LDL 92; microalbumin/creatinine ratio 2.6; TSH 2.597, free T4 1.06, free T3 2.9  Labs: 06/22/12: Cholesterol 236, triglycerides 300, HDL 68, LDL, 108 (pre-gluten-free diet); TTG IgA 6.4 (<20); IgA 152 (69-380); urinary microalbumin/creatinine ratio 3.1  Labs: 12/25/11: TSH 1.259, free T4 1.26, free T3 3.2; CMP normal          Assessment and Plan:   ASSESSMENT:  1. T1DM:  Her HbA1c is slightly higher, but her average BGs are unchanged. She is still having too many low BGs and then more higher BGs due to overtreatment at times, or due to forgetting to bolus at times, or due to forgetting to check BGs at bedtime on some  nights.   2. Hypoglycemia: She has had 16 BGs <80, the lowest being 41. Her low  BGs occurred during the period 12-3 PM, most often on the first or second day after having changed her sites. She needs less insulin during those times.  3. Hypertension: Her BP is good today. She needs to exercise for at least 4 hours per week. 4-6. Goiter/thyroiditis/transient hypothyroidism:   A. Her thyroid gland is again mildly enlarged today, but smaller and the lobes have shifted in size yet again. The pattern of waxing and waning of thyroid gland size is c/w evolving Hashimoto's Dz.   B. Her TFTs in March 2016 were at the low end of the normal range.  The fact that all three of her TFTs increased from September 2015 to March 2016 is pathognomonic for an interim flare up of Hashimoto's Dz. Her TFTs in February 2018 were well within the goal TSH range of 1.0-2.0.    7. Peripheral neuropathy: The neuropathy is not evident today.  8. Dizziness/orthostatic hypotension: Her symptoms have resolved.   9-10. Autonomic neuropathy and tachycardia: Her autonomic neuropathy has improved, so her heart rate is normal.     11. Geographic tongue: Her tongue has resolved today. She still needs to take her MVI daily. I suspect that when she has osmotic diuresis she is losing more B vitamins and minerals. She may also be losing minerals with diarrhea. 12. Hypercholesterolemia: We need to repeat her fasting lipids. We may need to start her on a statin.    PLAN: 1. Diagnostic: We reviewed her lab results from February and  her HbA1c from today. Call in two weeks on a Wednesday or Sunday evening to discuss BGs. Fasting lipid level soon.  2. Therapeutic: Use her sensor. Continue lisinopril at 5 mg/day. Check BGs at bedtime. Resume exercising. Look onto obtaining a Medtronic 670G pump and Guardian 3 sensor.  New basal rates:  MN: 0.625 -> 0.650 4 AM: 1.10 ->1.15 9 AM: 0.400 -> 0.300 11 AM: 0.650 ->0.600 6 PM: 0.600 -> 0.625 10 PM:  0.300 -> 0.325 3. Patient education: We discussed issues of hyperglycemia, hypoglycemia, thyroiditis, weight gain, dehydration, and hypertension at length. I reviewed a blinded monthly report of one of our patients who had been using the Medtronic 670G pump and Guardian 3 sensor for 4 months. I compared his BG and SG pattern with her BG pattern to show her the advantages of the new pump and sensor system in auto mode.  4. Follow-up: 3 months  Level of Service: This visit lasted in excess of 70 minutes. More than 50% of the visit was devoted to counseling.  Molli Knock, MD, CDE Adult and Pediatric Endocrinology 02/17/2017 3:00 PM

## 2017-03-20 ENCOUNTER — Other Ambulatory Visit: Payer: Self-pay | Admitting: "Endocrinology

## 2017-03-20 DIAGNOSIS — E1065 Type 1 diabetes mellitus with hyperglycemia: Principal | ICD-10-CM

## 2017-03-20 DIAGNOSIS — IMO0001 Reserved for inherently not codable concepts without codable children: Secondary | ICD-10-CM

## 2017-05-21 ENCOUNTER — Encounter (INDEPENDENT_AMBULATORY_CARE_PROVIDER_SITE_OTHER): Payer: Self-pay | Admitting: "Endocrinology

## 2017-05-21 ENCOUNTER — Ambulatory Visit (INDEPENDENT_AMBULATORY_CARE_PROVIDER_SITE_OTHER): Payer: BLUE CROSS/BLUE SHIELD | Admitting: "Endocrinology

## 2017-05-21 VITALS — BP 120/76 | HR 98 | Ht 66.81 in | Wt 178.6 lb

## 2017-05-21 DIAGNOSIS — E049 Nontoxic goiter, unspecified: Secondary | ICD-10-CM | POA: Diagnosis not present

## 2017-05-21 DIAGNOSIS — Z23 Encounter for immunization: Secondary | ICD-10-CM

## 2017-05-21 DIAGNOSIS — I10 Essential (primary) hypertension: Secondary | ICD-10-CM

## 2017-05-21 DIAGNOSIS — E1065 Type 1 diabetes mellitus with hyperglycemia: Secondary | ICD-10-CM | POA: Diagnosis not present

## 2017-05-21 DIAGNOSIS — E063 Autoimmune thyroiditis: Secondary | ICD-10-CM | POA: Diagnosis not present

## 2017-05-21 DIAGNOSIS — E11649 Type 2 diabetes mellitus with hypoglycemia without coma: Secondary | ICD-10-CM

## 2017-05-21 DIAGNOSIS — E1043 Type 1 diabetes mellitus with diabetic autonomic (poly)neuropathy: Secondary | ICD-10-CM | POA: Diagnosis not present

## 2017-05-21 DIAGNOSIS — IMO0001 Reserved for inherently not codable concepts without codable children: Secondary | ICD-10-CM

## 2017-05-21 DIAGNOSIS — E1042 Type 1 diabetes mellitus with diabetic polyneuropathy: Secondary | ICD-10-CM | POA: Diagnosis not present

## 2017-05-21 DIAGNOSIS — E7800 Pure hypercholesterolemia, unspecified: Secondary | ICD-10-CM

## 2017-05-21 LAB — POCT GLUCOSE (DEVICE FOR HOME USE): POC GLUCOSE: 315 mg/dL — AB (ref 70–99)

## 2017-05-21 LAB — POCT GLYCOSYLATED HEMOGLOBIN (HGB A1C): Hemoglobin A1C: 7.8

## 2017-05-21 NOTE — Progress Notes (Signed)
Subjective:  Patient Name: Victoria Atkinson Date of Birth: 01/13/1991  MRN: [id number]  Victoria Atkinson  presents to the office today for follow-up evaluation and management of her type 1 diabetes mellitus, goiter, hypoglycemia, peripheral neuropathy, autonomic neuropathy with inappropriate sinus tachycardia, migraine headaches, goiter, Hashimoto's disease, geographic tongue, obesity, and hypertension.  HISTORY OF PRESENT ILLNESS:   [name] is a 27 y.o. Caucasian young woman.  [name] was unaccompanied.   1. The patient was first referred to me on 10/31/04 by Dr. Crista LuriaFrieda Menzer from [facility] for evaluation and management of new-onset type 1 diabetes mellitus. The patient was 27 years old.  A. The patient had had about a 2 month period of polyuria, polydipsia, nocturia, upset stomach, nausea, stomach pains, and a documented 17 pound weight loss. She was often dizzy and lightheaded. At her PCP's office a serum glucose was 357. A serum CO2 was 26. I arranged to see the patient that day. 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 Family history was positive for a cousin with type 1 diabetes mellitus, a maternal grandmother with a "sluggish" thyroid, and a maternal aunt who took thyroid medicine. On physical examination her height was 65-1/2 inches. She weighed 99-1/2 pounds. Her BMI was 16.4. Her CBG was High, which was greater than 500 on our meter. Her hemoglobin A1c was 12.6%.  B. We provided diabetes education to the patient and her family in our office. I started her on a Novolog sliding scale regimen. I subsequently changed the Novolog to our two-component method with a correction dose and food dose at mealtimes. I also added Lantus insulin.  2. During the past thirteen years, the patient has done fairly well overall. She had a good honeymoon period that lasted about 9 months, during which her hemoglobin A1c's varied from 6.0-6.3%. On 08/23/2005 we started her  on insulin pump therapy with a Medtronic Paradigm 722 insulin pump. Since then, her hemoglobin A1c values have varied from 6.5-8.3%. She converted to a Medtronic 530G pump in the spring of 2015. She did not like the Enlite sensor, so has not used it much.  3. On 10/21/2005, as part of an evaluation for headaches, an MRI was performed. A 3.1 x 6.2 x 11.0 cm posterior fossa arachnoid cyst was noted. Subsequent [facility] felt that she had a mega cisterna magna rather than a cyst. This was considered to be a normal variant and was not felt to be causing her headaches. She was told that surgery would not be required.  4. The patient's last [facility] visit was on 02/17/17. At that time we changed several basal rates. She is not sure whether or not those changes helped.  A. In the interim, she has been healthy. She has not had any further migraines. She has not had many frontal headaches unless she drinks too much caffeine and then has withdrawal headaches.     B. She is still following the gluten-free and dairy-free diet. She still takes some non-milk dairy products.   C. She does not wear her Enlite CGM sensor.   D. She uses Humalog lispro in her Medtronic 530G pump. Her pump will be out of warranty in February. She wants to order a 670G pump and Guardian 3 sensor. She still takes lisinopril, 5 mg/day. She frequently forgets to take her MVI.    E. She has glucose tabs in her car,but does  not carry them with her She has juice at work and both juice and glucose tablets at home.    5. Pertinent Review of Systems:  Constitutional: The patient feels "pretty good". She has been healthy. Her allergies act up all the time.   Eyes: Vision is good with her glasses. Her last exam was in May 2018. She had no signs of diabetic eye disease. There are no significant eye complaints today.  Mouth: Her oropharynx is normal. She has only one spot of  geographic tongue today.  Neck: The patient has not had any recent anterior neck soreness and pressure sensations.  Heart: She has not had any heart racing at rest despite resuming caffeine use. Heart rate increases with exercise or other physical activity. The patient has no complaints of palpitations, irregular heat beats, chest pain, or chest pressure. Gastrointestinal: She still has a little postprandial bloating at times, especially after having certain foods, such as black beans. Her IBS is "okay". She does pretty well as long as she avoids dairy. She still does not drink enough liquids. Constipation is episodic. She has no complaints of reflux, excessive hunger, upset stomach, stomach aches or other pains. Legs: Muscle mass and strength seem normal. There are no complaints of numbness, tingling, burning, or pain. No edema is noted. Feet: There are no obvious foot problems. There are no complaints of numbness, tingling, burning, or pain. No edema is noted. GYN: She remains on oral contraceptive pills. Her LMP was 2-3 weeks ago.   Hypoglycemia: She has not had as many low BGs. When they occur, they tend to occur in the first 24 hours after changing pump sites at times.        6. BG printout: She changes her sites every 2-3 days, mostly every 3 days. She checks BGs 3-5 times per day, but misses most bedtime BG checks. . She boluses 2-5 times per day, usually 3-4 times. Her average BG is 225, compared with 192 at her last visit and with 192 at the visit prior. BGs are quite variable, with a range of 63 - >400, compared with 41 - >400 at the last visit and with <40 - >400 at her prior visit. She is doing a good job of changing her sites. She had 4 BGs <80, compared with 16 at her last visit. Three of the four low BGs occurred in the first 24 hours after site changes. She also had 6 BGs >400, 4 of which were due to failure to bolus and 2 were due to overtreating low BGs. When her sites are working well her  BGs vary from 173-192.  7. Sensor printout: None   PAST MEDICAL, FAMILY, AND SOCIAL HISTORY:  Past Medical History:  Diagnosis Date  . Brain cyst   . DM type 1 with diabetic peripheral neuropathy (HCC)   . Goiter   . Hashimoto's disease   . Hypertension   . Hypoglycemia associated with diabetes (HCC)   . Orthostatic hypotension   . Type 1 diabetes mellitus not at goal [facility])     Family History  Problem Relation Age of Onset  . Thyroid disease Maternal Aunt        Hypothyroid, takes thyroid medicine  . Thyroid disease Maternal Grandmother        Sluggish thyroid  . Cancer Paternal Grandfather   . Diabetes Cousin        T1 DM     Current Outpatient Medications:  .  glucose blood (ONE TOUCH  ULTRA TEST) test strip, CHECK SUGAR 10 X DAILY, Disp: 900 each, Rfl: 1 .  HUMALOG 100 UNIT/ML injection, USE 300 UNITS EVERY 48  HOURS IN INSULIN PUMP, Disp: 140 mL, Rfl: 4 .  lisinopril (PRINIVIL,ZESTRIL) 5 MG tablet, TAKE 1 TABLET BY MOUTH  DAILY, Disp: 90 tablet, Rfl: 5 .  loratadine (CLARITIN) 10 MG tablet, Take 10 mg by mouth daily., Disp: , Rfl:  .  Norethin Ace-Eth Estrad-FE (LOMEDIA 24 FE PO), Take by mouth., Disp: , Rfl:  .  Bisacodyl (DULCOLAX PO), Take by mouth., Disp: , Rfl:  .  dicyclomine (BENTYL) 20 MG tablet, Take 20 mg by mouth every 6 (six) hours., Disp: , Rfl:   Allergies as of 05/21/2017 - Review Complete 05/21/2017  Allergen Reaction Noted  . Zithromax [azithromycin] Hives 08/21/2010    1. Work and Family: She graduated from [employer] in 2014. 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 She is still contemplating going to grad school for a PhD in microbiology at some time in the future.  2. Activities: She has resumed playing oboe in an orchestra. She is trying to work less and do more things that she enjoys. She is trying to achieve a better balance and quality of life. Junella has not been physically active.    3. Smoking,  alcohol, or drugs: None 4. Primary Care Provider: Dr. Everlene Other at [facility]  REVIEW OF SYSTEMS: She is allergic to raspberries and poison ivy. There are no other significant problems involving Lakara's other body systems.   Objective:  Vital Signs:  BP 120/76   Pulse 98   Ht 5' 6.81" (1.697 m)   Wt 178 lb 9.6 oz (81 kg)   BMI 28.13 kg/m   HR 80   Ht Readings from Last 3 Encounters:  05/21/17 5' 6.81" (1.697 m)  10/09/16 5\' 7"  (1.702 m)  07/01/16 5' 6.89" (1.699 m)   Wt Readings from Last 3 Encounters:  05/21/17 178 lb 9.6 oz (81 kg)  02/17/17 178 lb (80.7 kg)  10/09/16 177 lb 9.6 oz (80.6 kg)  Facility age limit for growth percentiles is 20 years. Facility age limit for growth percentiles is 20 years.  PHYSICAL EXAM:  Constitutional: The patient appears healthy, but overweight. She has re-gained another 1/2 pound since last visit. Her Ideal Body Weight is 135 pounds. She is very bright, personable, and mature. She does not have a scale at home because she does not like to weigh herself. Face: The face appears normal.  Eyes: There is no obvious arcus or proptosis. Moisture appears normal. Mouth: The oropharynx is normal. Her tongue is normal today. Oral moisture is normal. Neck: The neck appears to be visibly normal. No carotid bruits are noted. The thyroid gland is slightly larger at about 22 grams in size. The lobes are both enlarged today, with the right lobe being the larger of the two. in comparison with her last visit when the lobes were symmetrically enlarged and the visit prior when the left lobe and isthmus were more enlarged, but the right lobe had shrunk back almost to normal. The consistency of the thyroid gland is normal. The thyroid gland is not tender to palpation. Lungs: The lungs are clear to auscultation. Air movement is good. Heart: Heart rate and rhythm are regular. Heart sounds S1 and S2 are normal. I did not appreciate any pathologic  cardiac murmurs. Abdomen: The abdomen is enlarged. Bowel  sounds are normal. There is no obvious hepatomegaly, splenomegaly, or other mass effect.  Arms: Muscle size and bulk are normal for age. Hands: There is no obvious tremor. Phalangeal and metacarpophalangeal joints are normal. Palmar muscles are normal. Palmar skin is normal. Palmar moisture is also normal. Legs: Muscles appear normal for age. No edema is present. Feet: Feet are normally formed. Dorsalis pedal pulses are faint 1+ bilaterally today.  PT pulses are faint 1+ bilaterally. Neurologic: Strength is normal for age in both the upper and lower extremities. Muscle tone is normal. Sensation to touch is normal in both the legs and feet.   LAB DATA:   Labs 05/21/17: HbA1c 7.8%, CBG 315  Labs 02/17/17: HbA1c 7.2%, CBG 155  Labs 10/09/16: HbA1c 7.1%, CBG 61 prior to a snack  Labs 07/01/16: HbA1c 7.2%  Labs 06/27/16: TSH 1.15, free T4 1.39, free T3 2.8; CMP normal except for glucose 263; cholesterol 214, triglycerides 110, HDL 69. LDL 123; microalbumin/creatinine ratio <1.9  Labs 03/28/16: HbA1c 7.6%  Labs 12/26/15: HbA1c 8.1% today   Labs 09/20/15: HbA1c 7.6%  Labs 02/21/15: HbA1c 7.6%.   Labs 11/16/14: HbA1c 7.1%  Labs 08/04/14: HbA1c 7.5%, CMP normal except for glucose of 201; urinary microalbumin/creatinine ratio 3.3; TSH 2.937, free T4 1.08, free T3 3.1; cholesterol 197, triglycerides 137, HDL 58, and LDL 112  Labs 1/61/09: Hemoglobin A1c was 7.9%, compared with 7.5% at last visit and with 6.9% at the visit prior.    Labs 01/25/14: HbA1c 8.4%; CMP normal except for glucose of 178; cholesterol 225, triglycerides 276, HDL 77, LDL 92; microalbumin/creatinine ratio 2.6; TSH 2.597, free T4 1.06, free T3 2.9  Labs: 06/22/12: Cholesterol 236, triglycerides 300, HDL 68, LDL, 108 (pre-gluten-free diet); TTG IgA 6.4 (<20); IgA 152 (69-380); urinary microalbumin/creatinine ratio 3.1  Labs: 12/25/11: TSH 1.259, free T4 1.26, free T3 3.2; CMP  normal          Assessment and Plan:   ASSESSMENT:  1. T1DM:  Her HbA1c is much higher, in part due to having only 25% of the low BGs that she had at her last visit. It appears that she has been taking in about 10% more carbs than she realized during the holiday period. She is still having some low BGs and then some higher BGs due to overtreatment. She also still forgets to bolus at times. She has not been checking BGs late at night very often. So has been missing some late night boluses. .  2. Hypoglycemia: She has had 4 BGs <80, the lowest at 63. Her low BGs occurred most often on the first or second day after having changed her sites. She needs less insulin during those times.  3. Hypertension: Her BP is fairly good today. She needs to exercise for at least 4 hours per week. 4-6. Goiter/thyroiditis/transient hypothyroidism:   A. Her thyroid gland is a\slightly larger today due to a major increase in the size of the right lobe. The pattern of waxing and waning of thyroid gland size is c/w evolving Hashimoto's Dz.   B. Her TFTs in March 2016 were at the low end of the normal range.  The fact that all three of her TFTs increased from September 2015 to March 2016 is pathognomonic for an interim flare up of Hashimoto's Dz. Her TFTs in February 2018 were well within the goal TSH range of 1.0-2.0.  We need to repeat her TFTs soon.  7. Peripheral neuropathy: The neuropathy is not evident today.  8. Dizziness/orthostatic  hypotension: Her symptoms have resolved.   9-10. Autonomic neuropathy and tachycardia: Her autonomic neuropathy has improved, so her heart rate is normal.     11. Geographic tongue: She only has one spot of geographic tongue today. She still needs to take her MVI daily. I suspect that when she has osmotic diuresis she is losing more B vitamins and minerals. She may also be losing minerals with diarrhea. 12. Hypercholesterolemia: We need to repeat her fasting lipids. We may need to start  her on a statin.    PLAN: 1. Diagnostic: We reviewed her HbA1c and CBG today. Obtain fasting annual surveillance labs soon.    2. Therapeutic: Use her sensor. Continue lisinopril at 5 mg/day. Check BGs at bedtime. Resume exercising. Obtain a Medtronic 670G pump and Guardian 3 sensor. Continue current pump setting, to include basal rates:  MN: 0.650 4 AM: 1.15 9 AM: 0.300 11 AM: 0.600 6 PM:  0.625 10 PM: 0.325 3. Patient education: We discussed issues of hyperglycemia, hypoglycemia, thyroiditis, weight gain, dehydration, and hypertension at length. We discussed the advantages and disadvantages of the Medtronic 670G pump and Guardian 3 sensor, the Dexcom G6 and T-Slim pump, and the Omnipod pump. 4. Follow-up: 3 months  Level of Service: This visit lasted in excess of 60 minutes. More than 50% of the visit was devoted to counseling.  Molli Knock, MD, CDE Adult and Pediatric Endocrinology 05/21/2017 3:02 PM

## 2017-05-21 NOTE — Patient Instructions (Signed)
Follow up visit in 3 months. 

## 2017-07-01 ENCOUNTER — Other Ambulatory Visit (INDEPENDENT_AMBULATORY_CARE_PROVIDER_SITE_OTHER): Payer: Self-pay | Admitting: "Endocrinology

## 2017-07-01 DIAGNOSIS — IMO0001 Reserved for inherently not codable concepts without codable children: Secondary | ICD-10-CM

## 2017-07-01 DIAGNOSIS — E1065 Type 1 diabetes mellitus with hyperglycemia: Principal | ICD-10-CM

## 2017-07-02 ENCOUNTER — Other Ambulatory Visit (INDEPENDENT_AMBULATORY_CARE_PROVIDER_SITE_OTHER): Payer: Self-pay | Admitting: *Deleted

## 2017-08-04 ENCOUNTER — Encounter (INDEPENDENT_AMBULATORY_CARE_PROVIDER_SITE_OTHER): Payer: Self-pay | Admitting: "Endocrinology

## 2017-08-04 ENCOUNTER — Ambulatory Visit (INDEPENDENT_AMBULATORY_CARE_PROVIDER_SITE_OTHER): Payer: BLUE CROSS/BLUE SHIELD | Admitting: *Deleted

## 2017-08-04 ENCOUNTER — Encounter (INDEPENDENT_AMBULATORY_CARE_PROVIDER_SITE_OTHER): Payer: Self-pay | Admitting: *Deleted

## 2017-08-04 ENCOUNTER — Other Ambulatory Visit (INDEPENDENT_AMBULATORY_CARE_PROVIDER_SITE_OTHER): Payer: Self-pay | Admitting: *Deleted

## 2017-08-04 VITALS — BP 118/78 | HR 84 | Ht 66.81 in | Wt 181.0 lb

## 2017-08-04 DIAGNOSIS — E109 Type 1 diabetes mellitus without complications: Secondary | ICD-10-CM | POA: Diagnosis not present

## 2017-08-04 DIAGNOSIS — E1065 Type 1 diabetes mellitus with hyperglycemia: Principal | ICD-10-CM

## 2017-08-04 DIAGNOSIS — IMO0001 Reserved for inherently not codable concepts without codable children: Secondary | ICD-10-CM

## 2017-08-04 LAB — POCT GLUCOSE (DEVICE FOR HOME USE): POC GLUCOSE: 236 mg/dL — AB (ref 70–99)

## 2017-08-04 MED ORDER — GLUCOSE BLOOD VI STRP: ORAL_STRIP | 12 refills | Status: DC

## 2017-08-04 NOTE — Progress Notes (Signed)
Transfer pump settings to new 670 G insulin pump  Victoria Atkinson was here to start on her new 670G insulin pump and transfer insulin pump settings to the new Medtronic 670G. She has been using the old 530G insulin pump and is ready to upgrade and start the new Guardian sensor. Transferred insulin pump settings as listed below:  Insulin pump settings  Basal Rates  Time  U/Hr 12a-4a  0.650 4a-9a  1.150 9a-11a  0.300 11a-6p  0.600 6p-10p  0.625 10p-12a 0.325  Total Basal 16.300 Units  IC Ratio Time  Ratio 12a-6a  12 grms 6a-11a  5 grms 11a-12a 8 grms  Insulin sensitivity  Time  Sensitivity 12a-6a  50 mg/dL 1O-10R6a-11a  35 mg/dL [id number]-[id number]-12a 50 mg/dL  Blood Glucose Target Time  Target 12a-6a  140-140  6a-9p  120-120  9p-12a  140-140   Active Insulin Time  3 hours Maximum Basal Rate  2 hours Max Bolus   22 Units Dual/Square Wave Bolus Off BG Reminder   On Missed Bolus Reminder On 8:30a-9:30a Easy Bolus   Off Entry (Step)   0.10 Units Bolus Wizard    On Low Reservoir Alert  On  30 units Set Change Alert  On   3 days  Sensor Settings  Sensor    On High Alert   On  250 mg/dL Alert on Hight   On High Snooze    On  2 hours  Low Alert   On  75 mg/dL Alert on Low   On Low Snooze    On  15 mins  Suspend Before Low  On Resume alert   Off  Assessment/Plan Victoria Atkinson was able to enter her pump settings and sensor settings to the new Medtronic's 670G insulin pump with no problems. Patient is very familiar with insulin pumps and stated it was easy to manage the insulin pump. Patient was able to start sensor and tolerated very well the sensor application and started it with no problems.  Discussed the importance of calibration sensor, four times a day and before bedtime, bolusing before eating and care link sign up. Scheduled Auto mode start April 22, that way it will give her time to get used to the new insulin pump. Call our office if any questions or concerns regarding your diabetes, call  Medtronic's for any questions regarding your insulin pump and or sensors.

## 2017-08-11 ENCOUNTER — Encounter (INDEPENDENT_AMBULATORY_CARE_PROVIDER_SITE_OTHER): Payer: Self-pay | Admitting: *Deleted

## 2017-09-01 ENCOUNTER — Encounter (INDEPENDENT_AMBULATORY_CARE_PROVIDER_SITE_OTHER): Payer: Self-pay | Admitting: "Endocrinology

## 2017-09-01 ENCOUNTER — Ambulatory Visit (INDEPENDENT_AMBULATORY_CARE_PROVIDER_SITE_OTHER): Payer: BLUE CROSS/BLUE SHIELD | Admitting: *Deleted

## 2017-09-01 ENCOUNTER — Ambulatory Visit (INDEPENDENT_AMBULATORY_CARE_PROVIDER_SITE_OTHER): Payer: BLUE CROSS/BLUE SHIELD | Admitting: "Endocrinology

## 2017-09-01 VITALS — BP 112/68 | HR 100 | Ht 67.01 in | Wt 182.8 lb

## 2017-09-01 DIAGNOSIS — E1065 Type 1 diabetes mellitus with hyperglycemia: Principal | ICD-10-CM

## 2017-09-01 DIAGNOSIS — IMO0001 Reserved for inherently not codable concepts without codable children: Secondary | ICD-10-CM

## 2017-09-01 LAB — POCT GLYCOSYLATED HEMOGLOBIN (HGB A1C): HEMOGLOBIN A1C: 7.3

## 2017-09-01 LAB — POCT GLUCOSE (DEVICE FOR HOME USE): POC Glucose: 209 mg/dl — AB (ref 70–99)

## 2017-09-01 NOTE — Patient Instructions (Signed)
Follow up in one month. Call Dr. Fransico MichaelBrennan on Wednesday evening between 8:00-9:30 PM.

## 2017-09-01 NOTE — Progress Notes (Signed)
Subjective:  Patient Name: Victoria Atkinson Date of Birth: 13-May-1991  MRN: [id number]  Victoria Atkinson  presents to the office today for follow-up evaluation and management of her type 1 diabetes mellitus, goiter, hypoglycemia, peripheral neuropathy, autonomic neuropathy with inappropriate sinus tachycardia, migraine headaches, goiter, Hashimoto's disease, geographic tongue, obesity, and hypertension.  HISTORY OF PRESENT ILLNESS:   Victoria Atkinson is a 27 y.o. Caucasian young woman.  [name] was unaccompanied.   1. The patient was first referred to me on 10/31/04 by Dr. Crista Luria from [facility] for evaluation and management of new-onset type 1 diabetes mellitus. The patient was 27 years old.  A. The patient had had about a 2 month period of polyuria, polydipsia, nocturia, upset stomach, nausea, stomach pains, and a documented 17 pound weight loss. She was often dizzy and lightheaded. At her PCP's office a serum glucose was 357. A serum CO2 was 26. I arranged to see the patient that day. 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 Family history was positive for a cousin with type 1 diabetes mellitus, a maternal grandmother with a "sluggish" thyroid, and a maternal aunt who took thyroid medicine. On physical examination her height was 65-1/2 inches. She weighed 99-1/2 pounds. Her BMI was 16.4. Her CBG was High, which was greater than 500 on our meter. Her hemoglobin A1c was 12.6%.  B. We provided diabetes education to the patient and her family in our office. I started her on a Novolog sliding scale regimen. I subsequently changed the Novolog to our two-component method with a correction dose and food dose at mealtimes. I also added Lantus insulin.  2. During the past thirteen years, the patient has done fairly well overall. She had a good honeymoon period that lasted about 9 months, during which her hemoglobin A1c's varied from 6.0-6.3%. On 08/23/2005 we started her  on insulin pump therapy with a Medtronic Paradigm 722 insulin pump. Since then, her hemoglobin A1c values have varied from 6.5-8.3%. She converted to a Medtronic 530G pump in the spring of 2015. She did not like the Enlite sensor, so has not used it much.  3. On 10/21/2005, as part of an evaluation for headaches, an MRI was performed. A 3.1 x 6.2 x 11.0 cm posterior fossa arachnoid cyst was noted. Subsequent evaluation by neurosurgeons at [facility] and at [facility] felt that she had a mega cisterna magna rather than a cyst. This was considered to be a normal variant and was not felt to be causing her headaches. She was told that surgery would not be required.  4. The patient's last [facility] visit was on 05/21/16.   A. In the interim, she has been healthy. She has not had any further migraines. She has not had many frontal headaches unless she drinks too much caffeine and then has withdrawal headaches.     B. She is still following the gluten-free and dairy-free diet. She still takes some non-milk dairy products.   C. She started using her new Medtronic 670G insulin pump in manual mode and Guardian 3 sensor on 08/04/17. She will convert to auto mode today.  She really likes the new sensor.   D. She uses Humalog lispro in her Medtronic 670G pump.  She still takes lisinopril, 5 mg/day. She frequently forgets to take her MVI.    E. She now carries glucose tabs with her. She has juice at work and both juice and glucose tablets at home.  5. Pertinent Review of Systems:  Constitutional: The patient feels "pretty good". She has been healthy. Her allergies still act up frequently.   Eyes: Vision is good with her glasses. Her last eye exam was in May 2018. She had no signs of diabetic eye disease. There are no significant eye complaints today. She is due for a follow up exam soon.  Mouth: Her oropharynx is normal. She has no areas of geographic tongue at this time.  Neck: The  patient has not had any recent anterior neck soreness and pressure sensations.  Heart: She has not had any heart racing at rest despite resuming caffeine use. Heart rate increases with exercise or other physical activity. The patient has no complaints of palpitations, irregular heat beats, chest pain, or chest pressure. Gastrointestinal: She still has some postprandial bloating at times, but not as often. She had several weeks of acid reflux since her last visit. Marland Kitchen Her IBS is "okay". She does pretty well as long as she avoids dairy. She still does not drink enough liquids. Constipation is episodic, but less frequent. She has no complaints of excessive hunger, upset stomach, stomach aches or other pains. Legs: Muscle mass and strength seem normal. There are no complaints of numbness, tingling, burning, or pain. No edema is noted. Feet: There are no obvious foot problems. She sometimes has pain in the dorsum of her right foot. There are no complaints of numbness, tingling, burning, or pain. No edema is noted. GYN: She remains on oral contraceptive pills. Her LMP was one week ago.   Hypoglycemia: Wearing the new sensor ha shown her that she is having many more low BGs than she thought.        6. BG printout: She changes her sites every 2-4 times days, mostly every 3 days. She checks BGs 2-5 times per day, mostly 4 times. She boluses 2-5 times per day, usually 4 times. Her average BG is 227, compared with 225 at her last visit and with 192 at the visit prior. BGs are still quite variable, with a range of 63-385, compared with 63 - >400 at her last visit and with 41 - >400 at the prior visit. She is doing a good job of changing her sites. She had 3 BGs <70.   7. Sensor printout: Her SGs and BGs correlate well. Her average SG was 184. She has had 17 SGs <70. Her pump/sensor system is correctly suspending itself off when the SGs drop too low.   PAST MEDICAL, FAMILY, AND SOCIAL HISTORY:  Past Medical History:   Diagnosis Date  . Brain cyst   . DM type 1 with diabetic peripheral neuropathy (HCC)   . Goiter   . Hashimoto's disease   . Hypertension   . Hypoglycemia associated with diabetes (HCC)   . Orthostatic hypotension   . Type 1 diabetes mellitus not at goal [facility])     Family History  Problem Relation Age of Onset  . Thyroid disease Maternal Aunt        Hypothyroid, takes thyroid medicine  . Thyroid disease Maternal Grandmother        Sluggish thyroid  . Cancer Paternal Grandfather   . Diabetes Cousin        T1 DM     Current Outpatient Medications:  .  glucose blood (BAYER CONTOUR TEST) test strip, Check blood sugar 10x day (Use with insulin pump), Disp: 300 each, Rfl: 12 .  HUMALOG 100 UNIT/ML injection, USE 300 UNITS EVERY 48  HOURS IN INSULIN PUMP, Disp: 140 mL, Rfl: 4 .  lisinopril (PRINIVIL,ZESTRIL) 5 MG tablet, TAKE 1 TABLET BY MOUTH  DAILY, Disp: 90 tablet, Rfl: 5 .  loratadine (CLARITIN) 10 MG tablet, Take 10 mg by mouth daily., Disp: , Rfl:  .  Norethin Ace-Eth Estrad-FE (LOMEDIA 24 FE PO), Take by mouth., Disp: , Rfl:  .  Bisacodyl (DULCOLAX PO), Take by mouth., Disp: , Rfl:  .  dicyclomine (BENTYL) 20 MG tablet, Take 20 mg by mouth every 6 (six) hours., Disp: , Rfl:   Allergies as of 09/01/2017 - Review Complete 09/01/2017  Allergen Reaction Noted  . Zithromax [azithromycin] Hives 08/21/2010    1. Work and Family: She graduated from [employer] in 2014. 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 She is taking some classes at [employer] in preparation for applying to grad school next Fall for a PhD in microbiology.  2. Activities: She has resumed playing oboe in an orchestra. She is trying to work less and do more things that she enjoys. She is trying to achieve a better balance and quality of life. Cydnee has not been physically active.    3. Smoking, alcohol, or drugs: None 4. Primary Care Provider: Dr. Everlene Other at [facility]  at [facility] has departed. She will be assigned to a new PCP soon.  REVIEW OF SYSTEMS: She is allergic to raspberries and poison ivy. There are no other significant problems involving Scheryl's other body systems.   Objective:  Vital Signs:  BP 112/68   Pulse 100   Ht 5' 7.01" (1.702 m)   Wt 182 lb 12.8 oz (82.9 kg)   BMI 28.62 kg/m   HR 80   Ht Readings from Last 3 Encounters:  09/01/17 5' 7.01" (1.702 m)  05/21/17 5' 6.81" (1.697 m)  10/09/16 5\' 7"  (1.702 m)   Wt Readings from Last 3 Encounters:  09/01/17 182 lb 12.8 oz (82.9 kg)  05/21/17 178 lb 9.6 oz (81 kg)  02/17/17 178 lb (80.7 kg)  Facility age limit for growth percentiles is 20 years. Facility age limit for growth percentiles is 20 years.  PHYSICAL EXAM:  Constitutional: The patient appears healthy, but overweight. She has re-gained another 4 pounds since last visit. Her Ideal Body Weight is 135 pounds. She is very bright, personable, and mature. She does not have a scale at home because she does not like to weigh herself. Face: The face appears normal.  Eyes: There is no obvious arcus or proptosis. Moisture appears normal. Mouth: The oropharynx is normal. Her tongue is normal today. Oral moisture is normal. Neck: The neck appears to be visibly normal. No carotid bruits are noted. The thyroid gland is again enlarged at about 22 grams in size. The lobes are symmetrically enlarged today. The consistency of the thyroid gland is normal. The thyroid gland is not tender to palpation. Lungs: The lungs are clear to auscultation. Air movement is good. Heart: Heart rate and rhythm are regular. Heart sounds S1 and S2 are normal. I did not appreciate any pathologic cardiac murmurs. Abdomen: The abdomen is enlarged. Bowel sounds are normal. There is no obvious hepatomegaly, splenomegaly, or other mass effect.  Arms: Muscle size and bulk are normal for age. Hands: There is no obvious tremor. Phalangeal and metacarpophalangeal joints  are normal. Palmar muscles are normal. Palmar skin is normal. Palmar moisture is also normal. Legs: Muscles appear normal for age. No edema is  present. Feet: Feet are normally formed. Dorsalis pedal pulses are faint 1+ on the right and very faint 1+ on the left. Neurologic: Strength is normal for age in both the upper and lower extremities. Muscle tone is normal. Sensation to touch is normal in both the legs and feet.   LAB DATA:   Labs 09/01/17: HbA1c 7.3%, CBG 209  Labs 05/21/17: HbA1c 7.8%, CBG 315  Labs 02/17/17: HbA1c 7.2%, CBG 155  Labs 10/09/16: HbA1c 7.1%, CBG 61 prior to a snack  Labs 07/01/16: HbA1c 7.2%  Labs 06/27/16: TSH 1.15, free T4 1.39, free T3 2.8; CMP normal except for glucose 263; cholesterol 214, triglycerides 110, HDL 69. LDL 123; microalbumin/creatinine ratio <1.9  Labs 03/28/16: HbA1c 7.6%  Labs 12/26/15: HbA1c 8.1% today   Labs 09/20/15: HbA1c 7.6%  Labs 02/21/15: HbA1c 7.6%.   Labs 11/16/14: HbA1c 7.1%  Labs 08/04/14: HbA1c 7.5%, CMP normal except for glucose of 201; urinary microalbumin/creatinine ratio 3.3; TSH 2.937, free T4 1.08, free T3 3.1; cholesterol 197, triglycerides 137, HDL 58, and LDL 112  Labs 5/40/989/21/15: Hemoglobin A1c was 7.9%, compared with 7.5% at last visit and with 6.9% at the visit prior.    Labs 01/25/14: HbA1c 8.4%; CMP normal except for glucose of 178; cholesterol 225, triglycerides 276, HDL 77, LDL 92; microalbumin/creatinine ratio 2.6; TSH 2.597, free T4 1.06, free T3 2.9  Labs: 06/22/12: Cholesterol 236, triglycerides 300, HDL 68, LDL, 108 (pre-gluten-free diet); TTG IgA 6.4 (<20); IgA 152 (69-380); urinary microalbumin/creatinine ratio 3.1  Labs: 12/25/11: TSH 1.259, free T4 1.26, free T3 3.2; CMP normal          Assessment and Plan:   ASSESSMENT:  1-2. T1DM/hypoglycemia.:    A. Her HbA1c is lower, but at the cost of having many SGs <70.   B. Her new sensor and pump are doing a very good job of identifying hypoglycemic events and  suspending the pump temporarily. Once she goes live on auto mode today she should have fewer SGs dropping to 70 or exceeding 300.   C. When Ms Celene Skeenbarra started [name] on auto mode today,  she suggested reducing some of Tallia's ICRs. I agreed. 3. Hypertension: Her BP is good today. She needs to exercise for at least 4 hours per week. 4-6. Goiter/thyroiditis/transient hypothyroidism:   A. Her thyroid gland is still enlarged at the same overall size, but the size of the lobes has shifted again. The pattern of waxing and waning of thyroid gland size is c/w evolving Hashimoto's Dz.   B. Her TFTs in March 2016 were at the low end of the normal range.  The fact that all three of her TFTs increased from September 2015 to March 2016 was pathognomonic for an interim flare up of Hashimoto's Dz. Her TFTs in February 2018 were well within the goal TSH range of 1.0-2.0.  Since she did not get her TFTs drawn last time, we need to repeat them soon.   7. Peripheral neuropathy: The neuropathy is not evident today.  8. Dizziness/orthostatic hypotension: Her symptoms have resolved.   9-10. Autonomic neuropathy and tachycardia: Her autonomic neuropathy has improved, but her HR is still elevated, it will take more time for the HR to normalize. so her heart rate is normal.     11. Geographic tongue: She has no spots of geographic tongue today. She still needs to take her MVI daily. I suspect that when she has osmotic diuresis she is losing more B vitamins and minerals.  12. Hypercholesterolemia: We  need to repeat her fasting lipids. We may need to start her on a statin.    PLAN: 1. Diagnostic: We reviewed her HbA1c and CBG today. We also reviewed her BG download and her SG download. Obtain fasting annual surveillance labs soon.  Call me on Wednesday evening.  2. Therapeutic: Use her sensor. Continue lisinopril at 5 mg/day. Check BGs at bedtime. Resume exercising. Go on auto mode today. Continue current pump setting, to  include basal rates:  MN: 0.650 4 AM: 1.15 9 AM: 0.300 11 AM: 0.600 6 PM:  0.625 10 PM: 0.325 3. Patient education: We discussed issues of hyperglycemia, hypoglycemia, thyroiditis, weight gain, dehydration, and hypertension at length.  4. Follow-up: 1 month  Level of Service: This visit lasted in excess of 55 minutes. More than 50% of the visit was devoted to counseling.  Molli Knock, MD, CDE Adult and Pediatric Endocrinology 09/01/2017 3:10 PM

## 2017-09-02 NOTE — Progress Notes (Signed)
Auto Mode Start  Start 4:pm  End Time 4:40pm total Time 40 mins  Victoria Atkinson was here to start on the Auto mode feature of her 670G insulin pump. She has been using for about one month is very happy with the insulin pump and the guardian sensor. She is excited to start on the Auto mode feature today.   Reviews and reminders before starting Auto Mode Start   . Calibrating 4 times a day is optimal. It is best to calibrate when your glucose is not changing very rapidly 1 arrow up or down ok, 2-3 arrows not good calibration.  . Correct carb entry before a meal and at bedtime . Care Link Account activation    Note: When the Auto Mode setting is turned on, other steps must be completed for it to activate, or start working.  If you are using Suspend before low or suspend on low, they are automatically turned off when Auto Mode becomes active.   Safe Basal Mode is different than Auto Mode, it activates when: 1. An SG reading is not available because your transmitter and pump are not communicating, or the sensor calibration has expired. 2. Your sensor might be reading lower than your actual glucose values. 3. Your BG value is different from your SG value by 35% or more. 4. After you, change your sensor, during the sensor warm up. 5. Auto Mode has been at your personal minimum Auto Mode basal delivery rate for 2 1/2 hours. 6. Auto Mode has been at your personal maximum Auto Mode basal delivery rate for 4 hours.   The maximum time your pump will stay in Safe Basal is 90 minutes. However, it may be shorter than that and resolve itself before you are aware of it.    Examples of these actions are entering a calibration, entering a new BG, or responding to a Lost Sensor alert.   Returning to Auto Mode Do not use Auto Mode for a period after giving a manual injection of insulin by syringe or pen. Manual injections are not accounted for in Auto Mode. Therefore, Auto Mode could deliver too much insulin. Too  much insulin may cause Hypoglycemia.    Alarms and alerts in Auto Mode   The following alerts and alarms are experienced only during Auto Mode. The pump will exit Auto Mode if:   Auto Mode has been at maximum basal delivery for 4 hours.  Enter BG to continue in Auto Mode.   Or    Auto Mode has been at minimum delivery for 2:30 hours. Enter BG to continue in Auto Mode. Select OK. Enter a BG to continue in Auto Mode.  Assessment /plan: Patient was able to start auto mode, she followed instructions on insulin pump checked BG and started Auto Mode with no problems. Patient verbalized understanding information given. Changed her Carb ratio 6a-11a from 5 to 8 grms, ok per Dr. Fransico Atkinson.  Call our office if any questions regarding your diabetes and call Med[employer]'s if any Technical support questions.  Patient was advised by provider to call Sunday night with Bg values.

## 2017-09-09 ENCOUNTER — Encounter (INDEPENDENT_AMBULATORY_CARE_PROVIDER_SITE_OTHER): Payer: Self-pay | Admitting: *Deleted

## 2017-09-10 ENCOUNTER — Telehealth (INDEPENDENT_AMBULATORY_CARE_PROVIDER_SITE_OTHER): Payer: Self-pay | Admitting: "Endocrinology

## 2017-09-10 ENCOUNTER — Encounter (INDEPENDENT_AMBULATORY_CARE_PROVIDER_SITE_OTHER): Payer: Self-pay | Admitting: Pediatrics

## 2017-09-10 NOTE — Telephone Encounter (Signed)
°  Who's calling (name and relationship to patient) : Margaretha Glassing (Mom) Best contact number: [phone] Provider they see: Dr. Fransico Michael Reason for call: Mom wanted to discuss initiating an appeal for test strips.

## 2017-09-11 NOTE — Telephone Encounter (Signed)
Spoke to patient, advised paperwork sent for the test strip appeal.

## 2017-09-18 ENCOUNTER — Telehealth (INDEPENDENT_AMBULATORY_CARE_PROVIDER_SITE_OTHER): Payer: Self-pay | Admitting: "Endocrinology

## 2017-09-18 NOTE — Telephone Encounter (Signed)
°  Who's calling (name and relationship to patient) : Elyssa ([employer] Rep) Best contact number: [phone] Provider they see: Dr. Fransico Michael  Reason for call: Elyssa wanted to inform us that pt's [employer] no longer covers Contour strips through medical or their pharmacy benefits service.

## 2017-09-19 NOTE — Telephone Encounter (Signed)
Returned TC to [employer] and they stated that the her insurance will not cover any tet strips. They are not going to appeal, she needs to contact [employer] and or HR at her job to see if they can make an exception.

## 2017-10-01 ENCOUNTER — Encounter (INDEPENDENT_AMBULATORY_CARE_PROVIDER_SITE_OTHER): Payer: Self-pay | Admitting: "Endocrinology

## 2017-10-01 ENCOUNTER — Ambulatory Visit (INDEPENDENT_AMBULATORY_CARE_PROVIDER_SITE_OTHER): Payer: BLUE CROSS/BLUE SHIELD | Admitting: "Endocrinology

## 2017-10-01 VITALS — BP 108/76 | HR 80 | Ht 67.13 in | Wt 182.6 lb

## 2017-10-01 DIAGNOSIS — E1065 Type 1 diabetes mellitus with hyperglycemia: Secondary | ICD-10-CM

## 2017-10-01 DIAGNOSIS — IMO0001 Reserved for inherently not codable concepts without codable children: Secondary | ICD-10-CM

## 2017-10-01 LAB — POCT GLUCOSE (DEVICE FOR HOME USE): POC GLUCOSE: 164 mg/dL — AB (ref 70–99)

## 2017-10-01 NOTE — Patient Instructions (Signed)
Follow up visit in one month. Please call Ddr. Fransico Hjalmer Iovino next Wednesday evening between 8:00-9:30 PM.

## 2017-10-01 NOTE — Progress Notes (Signed)
Subjective:  Patient Name: Victoria Atkinson Date of Birth: [birth year]  MRN: [id number]  Victoria Atkinson  presents to the office today for follow-up evaluation and management of her type 1 diabetes mellitus, goiter, hypoglycemia, peripheral neuropathy, autonomic neuropathy with inappropriate sinus tachycardia, migraine headaches, goiter, Hashimoto's disease, geographic tongue, obesity, and hypertension.  HISTORY OF PRESENT ILLNESS:   Victoria Atkinson is a 27 y.o. Caucasian young woman.  Victoria Atkinson was unaccompanied.   1. The patient was first referred to me on 10/31/04 by Dr. Crista Luria from [facility] for evaluation and management of new-onset type 1 diabetes mellitus. The patient was 27 years old.  A. The patient had had about a 2 month period of polyuria, polydipsia, nocturia, upset stomach, nausea, stomach pains, and a documented 17 pound weight loss. She was often dizzy and lightheaded. At her PCP's office a serum glucose was 357. A serum CO2 was 26. I arranged to see the patient that day. 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 Family history was positive for a cousin with type 1 diabetes mellitus, a maternal grandmother with a "sluggish" thyroid, and a maternal aunt who took thyroid medicine. On physical examination her height was 65-1/2 inches. She weighed 99-1/2 pounds. Her BMI was 16.4. Her CBG was High, which was greater than 500 on our meter. Her hemoglobin A1c was 12.6%.  B. We provided diabetes education to the patient and her family in our office. I started her on a Novolog sliding scale regimen. I subsequently changed the Novolog to our two-component method with a correction dose and food dose at mealtimes. I also added Lantus insulin.  2. During the past thirteen years, the patient has done fairly well overall. She had a good honeymoon period that lasted about 9 months, during which her hemoglobin A1c's varied from 6.0-6.3%. On 08/23/2005 we started her  on insulin pump therapy with a Medtronic Paradigm 722 insulin pump. Since then, her hemoglobin A1c values have varied from 6.5-8.3%. She converted to a Medtronic 530G pump in the spring of 2015. She did not like the Enlite sensor, so did not use it much.  3. On 10/21/2005, as part of an evaluation for headaches, an MRI was performed. A 3.1 x 6.2 x 11.0 cm posterior fossa arachnoid cyst was noted. Subsequent evaluation by neurosurgeons at [facility] and at [facility] felt that she had a mega cisterna magna rather than a cyst. This was considered to be a normal variant and was not felt to be causing her headaches. She was told that surgery would not be required.  4. The patient's last [facility] visit was on 09/01/17.   A. In the interim, she has been healthy. She has not had any further migraines. She has not had many frontal headaches unless she drinks too much caffeine and then has withdrawal headaches.     B. She is still following the gluten-free and dairy-free diet. She still takes some non-milk dairy products.   C. She started using her new Medtronic 670G insulin pump in auto mode and Guardian 3 sensor on 09/01/17. She really likes the new sensor. Unfortunately she had been out of auto mode from 09/16/17 until last night.  D. She uses Humalog lispro in her Medtronic 670G pump.  She still takes lisinopril, 5 mg/day. She is now taking her MVI daily.   E. She now carries glucose tabs with her. She has juice at work and both juice and glucose  tablets at home.    5. Pertinent Review of Systems:  Constitutional: The patient feels "pretty good". She has been healthy. Her allergies have not been too bad in the past month.    Eyes: Vision is good with her glasses. Her last eye exam was in May 2018. She had no signs of diabetic eye disease. There are no significant eye complaints today.  Mouth: Her oropharynx is normal. She has areas of geographic tongue at this time.  Neck: The  patient has not had any recent anterior neck soreness and pressure sensations.  Heart: She has not had any heart racing at rest despite resuming caffeine use. Heart rate increases with exercise or other physical activity. The patient has no complaints of palpitations, irregular heat beats, chest pain, or chest pressure. Gastrointestinal: She still has some postprandial bloating at times, but not as often. She had several weeks of acid reflux since her last visit. Her IBS is "okay". She does pretty well as long as she avoids dairy. She still often does not drink enough liquids. Constipation is less frequent. She has no complaints of excessive hunger, upset stomach, stomach aches or other pains. Legs: Muscle mass and strength seem normal. There are no complaints of numbness, tingling, burning, or pain. No edema is noted. Feet: There are no obvious foot problems. There are no complaints of numbness, tingling, burning, or pain. No edema is noted. GYN: She remains on oral contraceptive pills. Her LMP was three weeks ago.   Hypoglycemia: Wearing the new sensor has shown her the effects of certain foods on her SGs. She has had several SGs <70.      6. BG printout: She changes her sites every 2-4 times days, mostly every 3 days. She checks BGs 3-5 times per day, mostly 4 times. She boluses 3-5 times per day, usually 4 times. Her average BG is 207, compared with 227 at her last visit and with 225 at the visit prior. BGs are still quite variable, with a range of 89 - >400, compared with 63-385 at her last visit and with 63 - >400 at her prior visit. She is doing a good job of changing her sites. She had no documented BGs <80.   7. Sensor printout: Her SGs and BGs correlate well. Her average SG was 173, compared with 184 at her last visit. She has had 6 SGs <70, compared with 17 at her last visit. She was out of auto mode from about 9 AM on 09/16/17 to 11 PM on 01/31/18. Her pump had kicked her out of auto mode for  lack of calibration, but she did not hear the alert. Because she had not been checking to see if the pump was displaying the [facility] signifying it was in auto mode, she id not realize that she was in manual mode, and so did not turn the auto mode back on.  Her pump/sensor system is correctly suspending itself when the SGs drop too low. When she was in auto mode her SG were mostly between 70-190. She still occasionally has some incorrectly low carb counts, resulting in much higher BGs after some meals.   PAST MEDICAL, FAMILY, AND SOCIAL HISTORY:  Past Medical History:  Diagnosis Date  . Brain cyst   . DM type 1 with diabetic peripheral neuropathy (HCC)   . Goiter   . Hashimoto's disease   . Hypertension   . Hypoglycemia associated with diabetes (HCC)   . Orthostatic hypotension   . Type  1 diabetes mellitus not at goal [facility])     Family History  Problem Relation Age of Onset  . Thyroid disease Maternal Aunt        Hypothyroid, takes thyroid medicine  . Thyroid disease Maternal Grandmother        Sluggish thyroid  . Cancer Paternal Grandfather   . Diabetes Cousin        T1 DM     Current Outpatient Medications:  .  albuterol (PROVENTIL HFA) 108 (90 Base) MCG/ACT inhaler, Inhale into the lungs., Disp: , Rfl:  .  dicyclomine (BENTYL) 20 MG tablet, Take 20 mg by mouth every 6 (six) hours., Disp: , Rfl:  .  fluticasone (FLONASE) 50 MCG/ACT nasal spray, 1 spray by Each Nare route daily., Disp: , Rfl:  .  glucose blood (BAYER CONTOUR TEST) test strip, Check blood sugar 10x day (Use with insulin pump), Disp: 300 each, Rfl: 12 .  HUMALOG 100 UNIT/ML injection, USE 300 UNITS EVERY 48  HOURS IN INSULIN PUMP, Disp: 140 mL, Rfl: 4 .  lisinopril (PRINIVIL,ZESTRIL) 5 MG tablet, TAKE 1 TABLET BY MOUTH  DAILY, Disp: 90 tablet, Rfl: 5 .  loratadine (CLARITIN) 10 MG tablet, Take 10 mg by mouth daily., Disp: , Rfl:  .  Norethindrone-Ethinyl Estradiol-Fe Biphas (LO LOESTRIN FE) 1 MG-10 MCG / 10 MCG  tablet, Take by mouth., Disp: , Rfl:  .  Bisacodyl (DULCOLAX PO), Take by mouth., Disp: , Rfl:  .  Norethin Ace-Eth Estrad-FE (LOMEDIA 24 FE PO), Take by mouth., Disp: , Rfl:   Allergies as of 10/01/2017 - Review Complete 10/01/2017  Allergen Reaction Noted  . Zithromax [azithromycin] Hives 08/21/2010  . Raspberry  09/27/2013    1. Work and Family: She graduated from [employer] in 2014. 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 She is taking some classes at [employer] in preparation for applying to grad school next Fall for a PhD in microbiology.  2. Activities: She has resumed playing oboe in an orchestra. She is trying to work less and do more things that she enjoys. She is trying to achieve a better balance and quality of life. Victoria Atkinson has not been physically active.    3. Smoking, alcohol, or drugs: None 4. Primary Care Provider: Dr. Everlene Other at [facility] has departed. She will be assigned to a new PCP soon.  REVIEW OF SYSTEMS: She is allergic to raspberries and poison ivy. There are no other significant problems involving Victoria Atkinson's other body systems.   Objective:  Vital Signs:  BP 108/76   Pulse 80   Ht 5' 7.13" (1.705 m)   Wt 182 lb 9.6 oz (82.8 kg)   LMP 09/15/2017 (Within Days)   BMI 28.49 kg/m      Ht Readings from Last 3 Encounters:  10/01/17 5' 7.13" (1.705 m)  09/01/17 5' 7.01" (1.702 m)  09/01/17 5' 7.01" (1.702 m)   Wt Readings from Last 3 Encounters:  10/01/17 182 lb 9.6 oz (82.8 kg)  09/01/17 182 lb 12.2 oz (82.9 kg)  09/01/17 182 lb 12.8 oz (82.9 kg)  Facility age limit for growth percentiles is 20 years. Facility age limit for growth percentiles is 20 years.  PHYSICAL EXAM:  Constitutional: The patient appears healthy, but overweight. Her weight is unchanged. Her Ideal Body Weight is 135 pounds. She is very bright, personable, and mature. She does not have a scale at home because she does not  like to  weigh herself. Face: The face appears normal.  Eyes: There is no obvious arcus or proptosis. Moisture appears normal. Mouth: The oropharynx is normal. Her tongue has one spot of geographic tongue today. Oral moisture is normal. Neck: The neck appears to be visibly normal. No carotid bruits are noted. The thyroid gland is again enlarged at about 22 grams in size. The lobes are symmetrically enlarged today. The consistency of the thyroid gland is normal. The thyroid gland is not tender to palpation. Lungs: The lungs are clear to auscultation. Air movement is good. Heart: Heart rate and rhythm are regular. Heart sounds S1 and S2 are normal. I did not appreciate any pathologic cardiac murmurs. Abdomen: The abdomen is enlarged. Bowel sounds are normal. There is no obvious hepatomegaly, splenomegaly, or other mass effect.  Arms: Muscle size and bulk are normal for age. Hands: There is no obvious tremor. Phalangeal and metacarpophalangeal joints are normal. Palmar muscles are normal. Palmar skin is normal. Palmar moisture is also normal. Legs: Muscles appear normal for age. No edema is present. Feet: Feet are normally formed. Dorsalis pedal pulses are faint 1+ on the right and very faint 1+ on the left. Neurologic: Strength is normal for age in both the upper and lower extremities. Muscle tone is normal. Sensation to touch is normal in both the legs and feet.   LAB DATA:   Labs 10/01/17: CBG 164  Labs 09/01/17: HbA1c 7.3%, CBG 209  Labs 05/21/17: HbA1c 7.8%, CBG 315  Labs 02/17/17: HbA1c 7.2%, CBG 155  Labs 10/09/16: HbA1c 7.1%, CBG 61 prior to a snack  Labs 07/01/16: HbA1c 7.2%  Labs 06/27/16: TSH 1.15, free T4 1.39, free T3 2.8; CMP normal except for glucose 263; cholesterol 214, triglycerides 110, HDL 69. LDL 123; microalbumin/creatinine ratio <1.9  Labs 03/28/16: HbA1c 7.6%  Labs 12/26/15: HbA1c 8.1% today   Labs 09/20/15: HbA1c 7.6%  Labs 02/21/15: HbA1c 7.6%.   Labs 11/16/14: HbA1c  7.1%  Labs 08/04/14: HbA1c 7.5%, CMP normal except for glucose of 201; urinary microalbumin/creatinine ratio 3.3; TSH 2.937, free T4 1.08, free T3 3.1; cholesterol 197, triglycerides 137, HDL 58, and LDL 112  Labs 5/78/46: Hemoglobin A1c was 7.9%, compared with 7.5% at last visit and with 6.9% at the visit prior.    Labs 01/25/14: HbA1c 8.4%; CMP normal except for glucose of 178; cholesterol 225, triglycerides 276, HDL 77, LDL 92; microalbumin/creatinine ratio 2.6; TSH 2.597, free T4 1.06, free T3 2.9  Labs: 06/22/12: Cholesterol 236, triglycerides 300, HDL 68, LDL, 108 (pre-gluten-free diet); TTG IgA 6.4 (<20); IgA 152 (69-380); urinary microalbumin/creatinine ratio 3.1  Labs: 12/25/11: TSH 1.259, free T4 1.26, free T3 3.2; CMP normal          Assessment and Plan:   ASSESSMENT:  1-2. T1DM/hypoglycemia.:    A. Her HbA1c was lower at her last visit, but at the cost of having many SGs <70.   B. When she was in auto mode in late April and early May, her SGs and BGs correlated well and her SGs and BGs were much better controlled and much less variable. Her new sensor and pump are doing a very good job of identifying hypoglycemic events and suspending the pump temporarily. She needs to check her [facility] frequently to ensure that she is in auto mode..   C. We identified the issue that caused her to be out of auto mode for the past two weeks. 3. Hypertension: Her BP is better today. She needs to  exercise for at least 4 hours per week. 4-6. Goiter/thyroiditis/transient hypothyroidism:   A. Her thyroid gland is still enlarged at the same overall size, but the size of the lobes has shifted again. The pattern of waxing and waning of thyroid gland size is c/w evolving Hashimoto's Dz.   B. Her TFTs in March 2016 were at the low end of the normal range.  The fact that all three of her TFTs increased from September 2015 to March 2016 was pathognomonic for an interim flare up of Hashimoto's Dz. Her TFTs in  February 2018 were well within the goal TSH range of 1.0-2.0.  Since she did not get her TFTs drawn last time, we need to repeat them soon.   7. Peripheral neuropathy: The neuropathy is not evident today.  8. Dizziness/orthostatic hypotension: Her symptoms have resolved.   9-10. Autonomic neuropathy and tachycardia: Her autonomic neuropathy and tachycardia have improved.  11. Geographic tongue: She has one spot of geographic tongue today. She still needs to take her MVI daily. I suspect that when she has osmotic diuresis she is losing more B vitamins and minerals.  12. Hypercholesterolemia: We need to repeat her fasting lipids. We may need to start her on a statin.    PLAN: 1. Diagnostic: We reviewed her CBG today. We also reviewed her BG download and her SG download. Obtain fasting annual surveillance labs soon.  Call me on next Wednesday evening.  2. Therapeutic: Use her sensor. Continue lisinopril at 5 mg/day. Check BGs at bedtime. Resume exercising.  Continue current pump setting, to include basal rates:  MN: 0.650 4 AM: 1.15 9 AM: 0.300 11 AM: 0.600 6 PM:  0.625 10 PM: 0.325 3. Patient education: We discussed issues of hyperglycemia, hypoglycemia, thyroiditis, weight gain, dehydration, and hypertension at length.  4. Follow-up: 1 month  Level of Service: This visit lasted in excess of 60 minutes. More than 50% of the visit was devoted to counseling.  Molli Knock, MD, CDE Adult and Pediatric Endocrinology 10/01/2017 4:05 PM

## 2017-10-30 ENCOUNTER — Ambulatory Visit (INDEPENDENT_AMBULATORY_CARE_PROVIDER_SITE_OTHER): Payer: BLUE CROSS/BLUE SHIELD | Admitting: "Endocrinology

## 2017-11-12 ENCOUNTER — Ambulatory Visit (INDEPENDENT_AMBULATORY_CARE_PROVIDER_SITE_OTHER): Payer: BLUE CROSS/BLUE SHIELD | Admitting: "Endocrinology

## 2017-11-12 ENCOUNTER — Encounter (INDEPENDENT_AMBULATORY_CARE_PROVIDER_SITE_OTHER): Payer: Self-pay | Admitting: "Endocrinology

## 2017-11-12 VITALS — BP 122/74 | HR 88 | Ht 67.0 in | Wt 180.4 lb

## 2017-11-12 DIAGNOSIS — E7800 Pure hypercholesterolemia, unspecified: Secondary | ICD-10-CM

## 2017-11-12 DIAGNOSIS — I10 Essential (primary) hypertension: Secondary | ICD-10-CM

## 2017-11-12 DIAGNOSIS — E1043 Type 1 diabetes mellitus with diabetic autonomic (poly)neuropathy: Secondary | ICD-10-CM | POA: Diagnosis not present

## 2017-11-12 DIAGNOSIS — E063 Autoimmune thyroiditis: Secondary | ICD-10-CM | POA: Diagnosis not present

## 2017-11-12 DIAGNOSIS — E10649 Type 1 diabetes mellitus with hypoglycemia without coma: Secondary | ICD-10-CM | POA: Diagnosis not present

## 2017-11-12 DIAGNOSIS — E049 Nontoxic goiter, unspecified: Secondary | ICD-10-CM

## 2017-11-12 DIAGNOSIS — E1042 Type 1 diabetes mellitus with diabetic polyneuropathy: Secondary | ICD-10-CM | POA: Diagnosis not present

## 2017-11-12 DIAGNOSIS — K141 Geographic tongue: Secondary | ICD-10-CM | POA: Diagnosis not present

## 2017-11-12 DIAGNOSIS — E1065 Type 1 diabetes mellitus with hyperglycemia: Secondary | ICD-10-CM | POA: Diagnosis not present

## 2017-11-12 DIAGNOSIS — IMO0001 Reserved for inherently not codable concepts without codable children: Secondary | ICD-10-CM

## 2017-11-12 DIAGNOSIS — R000 Tachycardia, unspecified: Secondary | ICD-10-CM

## 2017-11-12 LAB — POCT GLUCOSE (DEVICE FOR HOME USE): POC GLUCOSE: 274 mg/dL — AB (ref 70–99)

## 2017-11-12 NOTE — Patient Instructions (Signed)
Follow up visit on 3 months.  

## 2017-11-12 NOTE — Progress Notes (Signed)
Subjective:  Patient Name: Victoria Atkinson Date of Birth: 05-01-1991  MRN: [id number]  Victoria Atkinson  presents to the office today for follow-up evaluation and management of her type 1 diabetes mellitus, goiter, hypoglycemia, peripheral neuropathy, autonomic neuropathy with inappropriate sinus tachycardia, migraine headaches, goiter, Hashimoto's disease, geographic tongue, obesity, and hypertension.  HISTORY OF PRESENT ILLNESS:   Victoria Atkinson is a 27 y.o. Caucasian young woman.  Victoria Atkinson was unaccompanied.   1. The patient was first referred to me on 10/31/04 by Dr. Crista LuriaFrieda Menzer from [facility] for evaluation and management of new-onset type 1 diabetes mellitus. The patient was 27 years old.  A. The patient had had about a 2 month period of polyuria, polydipsia, nocturia, upset stomach, nausea, stomach pains, and a documented 17 pound weight loss. She was often dizzy and lightheaded. At her PCP's office a serum glucose was 357. A serum CO2 was 26. I arranged to see the patient that day. 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 Family history was positive for a cousin with type 1 diabetes mellitus, a maternal grandmother with a "sluggish" thyroid, and a maternal aunt who took thyroid medicine. On physical examination her height was 65-1/2 inches. She weighed 99-1/2 pounds. Her BMI was 16.4. Her CBG was High, which was greater than 500 on our meter. Her hemoglobin A1c was 12.6%.  B. We provided diabetes education to the patient and her family in our office. I started her on a Novolog sliding scale regimen. I subsequently changed the Novolog to our two-component method with a correction dose and food dose at mealtimes. I also added Lantus insulin.  2. During the past thirteen years, the patient has done fairly well overall.   A. She had a good honeymoon period that lasted about 9 months, during which her hemoglobin A1c's varied from 6.0-6.3%. On 08/23/2005 we started  her on insulin pump therapy with a Medtronic Paradigm 722 insulin pump. Since then, her hemoglobin A1c values have varied from 6.5-8.3%. She converted to a Medtronic 530G pump in the spring of 2015. She did not like the Enlite sensor, so did not use it much.  B. On 10/21/2005, as part of an evaluation for headaches, an MRI was performed. A 3.1 x 6.2 x 11.0 cm posterior fossa arachnoid cyst was noted. Subsequent evaluation by [facility]surgeons at [facility] and [facility] felt that she had a mega cisterna magna rather than a cyst. This was considered to be a normal variant and was not felt to be causing her headaches. She was told that surgery would not be required.  C. She started her new Medtronic 670G insulin pump and guardian 3 sensor on 08/04/17 and started in auto mode on 09/01/17..  4. The patient's last [facility] visit was on 10/01/17. At that visit I continued her lisinopril and all of her pump settings.  A. In the interim, she has been healthy. She has not had any further migraines. She has had some pain in the lateral aspects of her left eye at times if she turned her eyes too rapidly to the right. Her allergic conjunctivitis has been worse. She has not had many frontal headaches.     B. She is still following the gluten-free and dairy-free diet. She still takes some non-milk dairy products.   C. She likes her new Medtronic 670G insulin pump and Guardian 3 sensor. She has done a lot better about checking BGs and staying in  auto mode.   D. She uses Humalog lispro in her Medtronic 670G pump.  She still takes lisinopril, 5 mg/day. She is now taking her MVI frequently, but not as well as at last visit.   E. She now carries glucose tabs with her. She has juice at work and both juice and glucose tablets at home.   F. She ran out of sensors last week. [employer] seems to have lost the replacement sensors.   G. She has been walking more on the weekends.    5. Pertinent Review of  Systems:  Constitutional: The patient feels "pretty good". She has been healthy. Her allergies have been worse lately. Her pet hedgehog has been sick.     Eyes: As above. Vision is good with her glasses. Her last eye exam was in May 2018. She had no signs of diabetic eye disease. She is trying to schedule an exam for next week.  Mouth: Her oropharynx is normal. She has no areas of geographic tongue at this time.  Neck: The patient has not had any recent anterior neck soreness and pressure sensations.  Heart: She has not had any heart racing at rest despite resuming caffeine use. Heart rate increases with exercise or other physical activity. The patient has no complaints of palpitations, irregular heat beats, chest pain, or chest pressure. Gastrointestinal: She still has some postprandial bloating at times, but not as bad. She has not had any acid reflux recently. Her IBS is "okay". She does pretty well as long as she avoids dairy. Constipation is less frequent. She has no complaints of excessive hunger, upset stomach, stomach aches or other pains. Legs: Muscle mass and strength seem normal. There are no complaints of numbness, tingling, burning, or pain. No edema is noted. Feet: There are no obvious foot problems. There are no complaints of numbness, tingling, burning, or pain. No edema is noted. GYN: She remains on oral contraceptive pills. Her LMP was 2-3 weeks ago.   Hypoglycemia: Wearing the new sensor has shown her the effects of certain foods on her SGs. She has not had many SGs <70.      6. BG printout: She changes her sites every 1-4 times days, mostly every 3 days. She checks BGs 3-6 times per day, average 3.8 times. She boluses 2-4 times per day, usually 4 times. Her average BG is 187, compared with 207 at her last visit and with 227 at the visit prior. BGs are still quite variable, with a range of 58 - 478,  compared with 89 - >400 at her last visit and with 63 - 385 at her prior visit. BGs  were much better when she was in auto mode. She is doing a good job of changing her sites. She had one documented BG <80.   7. Sensor printout: Her SGs and BGs correlate well. Her average SG was 164, compared with 173 at her last visit. She has had 6 SGs <70, compared with 6 at her last visit. She was out of auto mode from 11/05/17-11/12/17. When she was in auto mode her SGs were lower overall and much more stable. She does appear to need  increased ICRs. When she was in manual mode her SG and BG variability was much worse, 58- >400.  Her pump/sensor system is correctly suspending itself when the SGs drop too low. When she was in auto mode her SG were mostly between 70-180s. She still occasionally has some incorrectly low carb counts, resulting in much higher  BGs after some meals.   PAST MEDICAL, FAMILY, AND SOCIAL HISTORY:  Past Medical History:  Diagnosis Date  . Brain cyst   . DM type 1 with diabetic peripheral neuropathy (HCC)   . Goiter   . Hashimoto's disease   . Hypertension   . Hypoglycemia associated with diabetes (HCC)   . Orthostatic hypotension   . Type 1 diabetes mellitus not at goal [facility]     Family History  Problem Relation Age of Onset  . Thyroid disease Maternal Aunt        Hypothyroid, takes thyroid medicine  . Thyroid disease Maternal Grandmother        Sluggish thyroid  . Cancer Paternal Grandfather   . Diabetes Cousin        T1 DM     Current Outpatient Medications:  .  albuterol (PROVENTIL HFA) 108 (90 Base) MCG/ACT inhaler, Inhale into the lungs., Disp: , Rfl:  .  dicyclomine (BENTYL) 20 MG tablet, Take 20 mg by mouth every 6 (six) hours., Disp: , Rfl:  .  fluticasone (FLONASE) 50 MCG/ACT nasal spray, 1 spray by Each Nare route daily., Disp: , Rfl:  .  HUMALOG 100 UNIT/ML injection, USE 300 UNITS EVERY 48  HOURS IN INSULIN PUMP, Disp: 140 mL, Rfl: 4 .  lisinopril (PRINIVIL,ZESTRIL) 5 MG tablet, TAKE 1 TABLET BY MOUTH  DAILY, Disp: 90 tablet, Rfl: 5 .   loratadine (CLARITIN) 10 MG tablet, Take 10 mg by mouth daily., Disp: , Rfl:  .  Norethindrone-Ethinyl Estradiol-Fe Biphas (LO LOESTRIN FE) 1 MG-10 MCG / 10 MCG tablet, Take by mouth., Disp: , Rfl:  .  Bisacodyl (DULCOLAX PO), Take by mouth., Disp: , Rfl:  .  glucose blood (BAYER CONTOUR TEST) test strip, Check blood sugar 10x day (Use with insulin pump), Disp: 300 each, Rfl: 12  Allergies as of 11/12/2017 - Review Complete 11/12/2017  Allergen Reaction Noted  . Zithromax [azithromycin] Hives 08/21/2010  . Raspberry  09/27/2013    1. Work and Family: She graduated from [employer] in 2014. 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 She is taking some classes at [employer] in preparation for applying to grad school to start in the Fall of 2020 for a PhD in microbiology.  2. Activities: She has resumed playing oboe in an orchestra. She is trying to work less and do more things that she enjoys. She is trying to achieve a better balance and quality of life. Victoria Atkinson has been walking more.    3. Smoking, alcohol, or drugs: None 4. Primary Care Provider: Dr. Everlene Other at [facility] has departed. She will be assigned to a new PCP soon.  REVIEW OF SYSTEMS: She is allergic to raspberries and poison ivy. There are no other significant problems involving Victoria Atkinson's other body systems.   Objective:  Vital Signs:  BP 122/74   Pulse 88   Ht 5\' 7"  (1.702 m)   Wt 180 lb 6.4 oz (81.8 kg)   LMP 10/21/2017 (Within Days)   BMI 28.25 kg/m      Ht Readings from Last 3 Encounters:  11/12/17 5\' 7"  (1.702 m)  10/01/17 5' 7.13" (1.705 m)  09/01/17 5' 7.01" (1.702 m)   Wt Readings from Last 3 Encounters:  11/12/17 180 lb 6.4 oz (81.8 kg)  10/01/17 182 lb 9.6 oz (82.8 kg)  09/01/17 182 lb 12.2 oz (82.9 kg)  Facility age limit for growth percentiles is  20 years. Facility age limit for growth percentiles is 20 years.  PHYSICAL EXAM:  Constitutional: The  patient appears healthy, but overweight. Her weight decreased 2 pounds. Her Ideal Body Weight is 135 pounds. She is very bright, personable, and mature. She does not have a scale at home because she does not like to weigh herself. Face: The face appears normal.  Eyes: There is no obvious arcus or proptosis. Moisture appears normal. Mouth: The oropharynx is normal. Her tongue has no spots of geographic tongue today. Oral moisture is normal. Neck: The neck appears to be visibly normal. No carotid bruits are noted. The thyroid gland is again enlarged at about 22 grams in size. Today the right lobe is smaller and the left lobe is larger. The consistency of the thyroid gland is normal. The thyroid gland is not tender to palpation. Lungs: The lungs are clear to auscultation. Air movement is good. Heart: Heart rate and rhythm are regular. Heart sounds S1 and S2 are normal. I did not appreciate any pathologic cardiac murmurs. Abdomen: The abdomen is enlarged. Bowel sounds are normal. There is no obvious hepatomegaly, splenomegaly, or other mass effect.  Arms: Muscle size and bulk are normal for age. Hands: There is no obvious tremor. Phalangeal and metacarpophalangeal joints are normal. Palmar muscles are normal. Palmar skin is normal. Palmar moisture is also normal. Legs: Muscles appear normal for age. No edema is present. Feet: Feet are normally formed. Dorsalis pedal pulses are faint 1+ on the right and 1+ on the left. Neurologic: Strength is normal for age in both the upper and lower extremities. Muscle tone is normal. Sensation to touch is normal in both the legs and feet.   LAB DATA:   Labs 11/12/17: CBG 274  Labs 10/01/17: CBG 164  Labs 09/01/17: HbA1c 7.3%, CBG 209  Labs 05/21/17: HbA1c 7.8%, CBG 315  Labs 02/17/17: HbA1c 7.2%, CBG 155  Labs 10/09/16: HbA1c 7.1%, CBG 61 prior to a snack  Labs 07/01/16: HbA1c 7.2%  Labs 06/27/16: TSH 1.15, free T4 1.39, free T3 2.8; CMP normal except for  glucose 263; cholesterol 214, triglycerides 110, HDL 69. LDL 123; microalbumin/creatinine ratio <1.9  Labs 03/28/16: HbA1c 7.6%  Labs 12/26/15: HbA1c 8.1% today   Labs 09/20/15: HbA1c 7.6%  Labs 02/21/15: HbA1c 7.6%.   Labs 11/16/14: HbA1c 7.1%  Labs 08/04/14: HbA1c 7.5%, CMP normal except for glucose of 201; urinary microalbumin/creatinine ratio 3.3; TSH 2.937, free T4 1.08, free T3 3.1; cholesterol 197, triglycerides 137, HDL 58, and LDL 112  Labs 1/61/09: Hemoglobin A1c was 7.9%, compared with 7.5% at last visit and with 6.9% at the visit prior.    Labs 01/25/14: HbA1c 8.4%; CMP normal except for glucose of 178; cholesterol 225, triglycerides 276, HDL 77, LDL 92; microalbumin/creatinine ratio 2.6; TSH 2.597, free T4 1.06, free T3 2.9  Labs: 06/22/12: Cholesterol 236, triglycerides 300, HDL 68, LDL, 108 (pre-gluten-free diet); TTG IgA 6.4 (<20); IgA 152 (69-380); urinary microalbumin/creatinine ratio 3.1  Labs: 12/25/11: TSH 1.259, free T4 1.26, free T3 3.2; CMP normal          Assessment and Plan:   ASSESSMENT:  1-2. T1DM/hypoglycemia.:    A. Her HbA1c was lower in April 2019, but at the cost of having many SGs <70.   B. When she was in auto mode in late April and early May, her SGs and BGs correlated well and her SGs and BGs were much better controlled and much less variable. When she was in auto mode  earlier this month the BGs were lower and more stable. When she was without sensors for week, however, BGs were much higher and more variable. She needs to check her Victoria Atkinson frequently to ensure that she is in auto mode.  C. Her new sensor and pump are doing a very good job of identifying and preventing hypoglycemic events and suspending the pump temporarily.    D. She does need stronger ICRs during the daytime hours. 3. Hypertension: Her BP is better today. She needs to exercise for at least 4 hours per week and continue her lisinopril. 4-6. Goiter/thyroiditis/transient hypothyroidism:    A. Her thyroid gland is still enlarged at the same overall size, but the size of the lobes has shifted again. The pattern of waxing and waning of thyroid gland size is c/w evolving Hashimoto's Dz.   B. Her TFTs in March 2016 were at the low end of the normal range.  The fact that all three of her TFTs increased from September 2015 to March 2016 was pathognomonic for an interim flare up of Hashimoto's Dz. Her TFTs in February 2018 were well within the goal TSH range of 1.0-2.0.  Since she did not get her TFTs drawn last time, we need to repeat them soon.   7. Peripheral neuropathy: The neuropathy is not evident today.  8. Dizziness/orthostatic hypotension: Her symptoms have resolved.   9-10. Autonomic neuropathy and tachycardia: Her autonomic neuropathy and tachycardia have improved.  11. Geographic tongue: She has no spots of geographic tongue today. She still needs to take her MVI daily. I suspect that when she has osmotic diuresis she is losing more B vitamins and minerals.  12. Hypercholesterolemia: We need to repeat her fasting lipids. We may need to start her on a statin.    PLAN: 1. Diagnostic: We reviewed her CBG today. We also reviewed her BG download and her SG download. Obtain fasting annual surveillance labs soon.  Call me in two weeks of a Wednesday evening.  2. Therapeutic: Use her sensor. Continue lisinopril at 5 mg/day. Check BGs at bedtime. Continue exercising.   A. New basal rates:  MN: 0.650 -> 0.800 4 AM: 1.15 -> 1.300 9 AM: 0.300 -> 0.325 11 AM: 0.600 -> 0.650 6 PM:  0.625 -> 0.650 10 PM: 0.325 -> 0.400  B. New ICRs: MN: 12 6 Am: 8 -> 7 11 Am: 8 -> 7  C. New ISFs: MN: 50 6 Am: 35 -> 30 11 AM: 50 -> 45  D. Targets: MN: 140 6 AM: 120 9 PM: 140 3. Patient education: We discussed issues of hyperglycemia, hypoglycemia, thyroiditis, weight gain, dehydration, and hypertension at length.  4. Follow-up: 3 months  Level of Service: This visit lasted in excess of 60  minutes. More than 50% of the visit was devoted to counseling.  Molli Knock, MD, CDE Adult and Pediatric Endocrinology 11/12/2017 4:04 PM

## 2017-11-24 ENCOUNTER — Other Ambulatory Visit (INDEPENDENT_AMBULATORY_CARE_PROVIDER_SITE_OTHER): Payer: Self-pay | Admitting: "Endocrinology

## 2017-12-01 LAB — TSH: TSH: 1.95 u[IU]/mL (ref 0.450–4.500)

## 2017-12-01 LAB — MICROALBUMIN / CREATININE URINE RATIO
Creatinine, Urine: 410.4 mg/dL
Microalb/Creat Ratio: 3.9 mg/g creat (ref 0.0–30.0)
Microalbumin, Urine: 15.9 ug/mL

## 2017-12-01 LAB — COMPREHENSIVE METABOLIC PANEL
A/G RATIO: 1.9 (ref 1.2–2.2)
ALT: 14 IU/L (ref 0–32)
AST: 12 IU/L (ref 0–40)
Albumin: 4.3 g/dL (ref 3.5–5.5)
Alkaline Phosphatase: 94 IU/L (ref 39–117)
BUN/Creatinine Ratio: 12 (ref 9–23)
BUN: 10 mg/dL (ref 6–20)
Bilirubin Total: 0.4 mg/dL (ref 0.0–1.2)
CO2: 24 mmol/L (ref 20–29)
Calcium: 9.1 mg/dL (ref 8.7–10.2)
Chloride: 99 mmol/L (ref 96–106)
Creatinine, Ser: 0.83 mg/dL (ref 0.57–1.00)
GFR calc Af Amer: 113 mL/min/{1.73_m2} (ref >59)
GFR calc non Af Amer: 98 mL/min/{1.73_m2} (ref >59)
GLOBULIN, TOTAL: 2.3 g/dL (ref 1.5–4.5)
Glucose: 223 mg/dL — ABNORMAL HIGH (ref 65–99)
POTASSIUM: 4.7 mmol/L (ref 3.5–5.2)
SODIUM: 139 mmol/L (ref 134–144)
TOTAL PROTEIN: 6.6 g/dL (ref 6.0–8.5)

## 2017-12-01 LAB — LIPID PANEL W/O CHOL/HDL RATIO
Cholesterol, Total: 196 mg/dL (ref 100–199)
HDL: 59 mg/dL (ref >39)
LDL Calculated: 120 mg/dL — ABNORMAL HIGH (ref 0–99)
Triglycerides: 87 mg/dL (ref 0–149)
VLDL Cholesterol Cal: 17 mg/dL (ref 5–40)

## 2017-12-01 LAB — T4, FREE: Free T4: 1.28 ng/dL (ref 0.82–1.77)

## 2017-12-01 LAB — SPECIMEN STATUS REPORT

## 2017-12-01 LAB — T3, FREE: T3, Free: 3.3 pg/mL (ref 2.0–4.4)

## 2017-12-08 ENCOUNTER — Telehealth (INDEPENDENT_AMBULATORY_CARE_PROVIDER_SITE_OTHER): Payer: Self-pay

## 2017-12-08 MED ORDER — PRAVASTATIN SODIUM 10 MG PO TABS: 10.0000 mg | ORAL_TABLET | Freq: Every evening | ORAL | 5 refills | Status: DC

## 2017-12-08 NOTE — Telephone Encounter (Signed)
-----   Message from [name] StallMichael J Brennan, MD sent at 12/07/2017 10:05 PM EDT ----- Thyroid tests were normal. Urine microalbumin/creatinine ratio was very good.  CP was normal except for glucose of 223. Cholesterols were elevated, but better than one year ago. It would be reasonable to start her on a low dose of pravastatin, 10 mg in the evening daily.  Clinical staff. If Ziya agrees to start the pravastatin, please send in a prescription for pravastatin, 10 mg, to be taken each evening, with a 30-day supply and 5 refills. Thanks.. Dr. Fransico MichaelBrennan

## 2017-12-08 NOTE — Telephone Encounter (Signed)
Spoke with patient and let her know per Dr. Fransico MichaelBrennan "Thyroid tests were normal. Urine microalbumin/creatinine ratio was very good.  CP was normal except for glucose of 223. Cholesterols were elevated, but better than one year ago. It would be reasonable to start her on a low dose of pravastatin, 10 mg in the evening daily."  Patient states [facility] is the preferred pharmacy for long term medications.

## 2018-01-19 ENCOUNTER — Other Ambulatory Visit (INDEPENDENT_AMBULATORY_CARE_PROVIDER_SITE_OTHER): Payer: Self-pay | Admitting: "Endocrinology

## 2018-01-19 DIAGNOSIS — E1065 Type 1 diabetes mellitus with hyperglycemia: Principal | ICD-10-CM

## 2018-01-19 DIAGNOSIS — IMO0001 Reserved for inherently not codable concepts without codable children: Secondary | ICD-10-CM

## 2018-02-02 ENCOUNTER — Encounter (INDEPENDENT_AMBULATORY_CARE_PROVIDER_SITE_OTHER): Payer: Self-pay | Admitting: *Deleted

## 2018-02-02 ENCOUNTER — Telehealth (INDEPENDENT_AMBULATORY_CARE_PROVIDER_SITE_OTHER): Payer: Self-pay | Admitting: "Endocrinology

## 2018-02-02 NOTE — Telephone Encounter (Signed)
Returned TC to patient to advise that I had contacted Medtronic rep for more suggestions and have forwarded the list through Mychart to her. Patient ok with info given.

## 2018-02-02 NOTE — Telephone Encounter (Signed)
°  Who's calling (name and relationship to patient) : Vikki (Self) Best contact number: [phone] Provider they see: Dr. Fransico MichaelBrennan Reason for call: Pt stated she is experiencing dermatitis as a result of sensor tape. Pt has used flonase and steroid creams on the site as suggested by clinic but would like to know if clinic has anymore suggestions to alleviate the reaction. Please advise.

## 2018-02-02 NOTE — Telephone Encounter (Signed)
Routed to LI 

## 2018-02-16 ENCOUNTER — Ambulatory Visit (INDEPENDENT_AMBULATORY_CARE_PROVIDER_SITE_OTHER): Payer: BLUE CROSS/BLUE SHIELD | Admitting: "Endocrinology

## 2018-02-16 ENCOUNTER — Encounter (INDEPENDENT_AMBULATORY_CARE_PROVIDER_SITE_OTHER): Payer: Self-pay | Admitting: "Endocrinology

## 2018-02-16 VITALS — BP 108/68 | HR 72 | Ht 66.81 in | Wt 178.6 lb

## 2018-02-16 DIAGNOSIS — E7800 Pure hypercholesterolemia, unspecified: Secondary | ICD-10-CM

## 2018-02-16 DIAGNOSIS — E1042 Type 1 diabetes mellitus with diabetic polyneuropathy: Secondary | ICD-10-CM

## 2018-02-16 DIAGNOSIS — E11649 Type 2 diabetes mellitus with hypoglycemia without coma: Secondary | ICD-10-CM | POA: Diagnosis not present

## 2018-02-16 DIAGNOSIS — E049 Nontoxic goiter, unspecified: Secondary | ICD-10-CM

## 2018-02-16 DIAGNOSIS — E1065 Type 1 diabetes mellitus with hyperglycemia: Secondary | ICD-10-CM | POA: Diagnosis not present

## 2018-02-16 DIAGNOSIS — I4711 Inappropriate sinus tachycardia, so stated: Secondary | ICD-10-CM

## 2018-02-16 DIAGNOSIS — IMO0001 Reserved for inherently not codable concepts without codable children: Secondary | ICD-10-CM

## 2018-02-16 DIAGNOSIS — R000 Tachycardia, unspecified: Secondary | ICD-10-CM | POA: Diagnosis not present

## 2018-02-16 DIAGNOSIS — K141 Geographic tongue: Secondary | ICD-10-CM

## 2018-02-16 DIAGNOSIS — E063 Autoimmune thyroiditis: Secondary | ICD-10-CM

## 2018-02-16 DIAGNOSIS — E1043 Type 1 diabetes mellitus with diabetic autonomic (poly)neuropathy: Secondary | ICD-10-CM

## 2018-02-16 DIAGNOSIS — I10 Essential (primary) hypertension: Secondary | ICD-10-CM

## 2018-02-16 LAB — POCT GLYCOSYLATED HEMOGLOBIN (HGB A1C): Hemoglobin A1C: 7.1 % — AB (ref 4.0–5.6)

## 2018-02-16 LAB — POCT GLUCOSE (DEVICE FOR HOME USE): POC GLUCOSE: 122 mg/dL — AB (ref 70–99)

## 2018-02-16 NOTE — Patient Instructions (Signed)
Follow up visit in 3 months. Resume using the CGM as soon as practicable.

## 2018-02-16 NOTE — Progress Notes (Signed)
Subjective:  Patient Name: Victoria Atkinson Date of Birth: 14-Jan-1991  MRN: [id number]  Victoria Atkinson  presents to the office today for follow-up evaluation and management of her type 1 diabetes mellitus, goiter, hypoglycemia, peripheral neuropathy, autonomic neuropathy with inappropriate sinus tachycardia, migraine headaches, goiter, Hashimoto's disease, geographic tongue, obesity, and hypertension.  HISTORY OF PRESENT ILLNESS:   Victoria Atkinson is a 27 y.o. Caucasian young woman.  Victoria Atkinson was unaccompanied.   1. The patient was first referred to me on 10/31/04 by Dr. Crista Luria from [facility] for evaluation and management of new-onset type 1 diabetes mellitus. The patient was 27 years old.  A. The patient had had about a 2 month period of polyuria, polydipsia, nocturia, upset stomach, nausea, stomach pains, and a documented 17 pound weight loss. She was often dizzy and lightheaded. At her PCP's office a serum glucose was 357. A serum CO2 was 26. I arranged to see the patient that day. 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 Family history was positive for a cousin with type 1 diabetes mellitus, a maternal grandmother with a "sluggish" thyroid, and a maternal aunt who took thyroid medicine. On physical examination her height was 65-1/2 inches. She weighed 99-1/2 pounds. Her BMI was 16.4. Her CBG was High, which was greater than 500 on our meter. Her hemoglobin A1c was 12.6%.  B. We provided diabetes education to the patient and her family in our office. I started her on a Novolog sliding scale regimen. I subsequently changed the Novolog to our two-component method with a correction dose and food dose at mealtimes. I also added Lantus insulin.  2. During the past thirteen years, the patient has done fairly well overall.   A. She had a good honeymoon period that lasted about 9 months, during which her hemoglobin A1c's varied from 6.0-6.3%. On 08/23/2005 we started  her on insulin pump therapy with a Medtronic Paradigm 722 insulin pump. Since then, her hemoglobin A1c values have varied from 6.5-8.3%. She converted to a Medtronic 530G pump in the spring of 2015. She did not like the Enlite sensor, so did not use it much.  B. On 10/21/2005, as part of an evaluation for headaches, an MRI was performed. A 3.1 x 6.2 x 11.0 cm posterior fossa arachnoid cyst was noted. Subsequent evaluation by neurosurgeons at [facility] and at [facility] felt that she had a mega cisterna magna rather than a cyst. This was considered to be a normal variant and was not felt to be causing her headaches. She was told that surgery would not be required.  C. She started her new Medtronic 670G insulin pump and Guardian 3 sensor on 08/04/17 and started in auto mode on 09/01/17.  4. The patient's last [facility] visit was on 11/12/17. At that visit I continued her lisinopril. I also modified her pump settings. After reviewing her lab results I started her on pravastatin, 10 mg/day.   A. In the interim, she has been healthy except for skin problems:    1). About two months ago she started to have intermittent hives on her face and neck..    2). About two weeks ago she had a blistering reaction at her sensor site. She also developed hives of both sides of her chest and ribcage. She has not used the sensor since then. She called Gearldine Bienenstock, our diabetes educator, who gave her several options of different adhesives to try, but Victoria Atkinson has  not yet been able to do so.   B. She had one brief migraine HA last week that resolved with Excedrin.   C. Her allergic conjunctivitis has been okay.      D. She is still following the gluten-free and dairy-free diet. She still takes some non-milk dairy products.   E. She likes her new Medtronic 670G insulin pump and Guardian 3 sensor. She does a lot better about checking BGs and staying in auto mode when she wears the sensor.    F. She uses  Humalog lispro in her Medtronic 670G pump.  She still takes lisinopril, 5 mg/day. She is no longer taking her MVI due to forgetfulness.    G. She now carries glucose tabs with her. She has juice at work and both juice and glucose tablets at home.   H. She has not been walking much on the weekends.    5. Pertinent Review of Systems:  Constitutional: The patient feels "pretty good". She has been healthy. Her allergies are "not too bad". Her pet hedgehog is well.      Eyes: As above. Vision is good with her glasses. Her last eye exam was in about July 2019. She had no signs of diabetic eye disease. She no longer has any pain in the lateral aspects of her left eye if she turned her eyes too rapidly to the right.  Mouth: Her oropharynx is normal. She has no areas of geographic tongue at this time.  Neck: The patient has not had any recent anterior neck soreness and pressure sensations.  Heart: She has not had any heart racing at rest despite resuming caffeine use. Heart rate increases with exercise or other physical activity. The patient has no complaints of palpitations, irregular heat beats, chest pain, or chest pressure. Gastrointestinal: She has not had much postprandial bloating. She has not had any acid reflux recently. Her IBS is "pretty good". She does pretty well as long as she avoids dairy. She has not been constipated. She has no complaints of excessive hunger, upset stomach, stomach aches or other pains. Legs: Muscle mass and strength seem normal. There are no complaints of numbness, tingling, burning, or pain. No edema is noted. Feet: There are no obvious foot problems. There are no complaints of numbness, tingling, burning, or pain. No edema is noted. GYN: She remains on oral contraceptive pills. Her LMP was 3 days ago.   Hypoglycemia: Not many      6. BG printout: She changes her sites every 1-4 times days, mostly every 3 days. She checks BGs 3-4 times per day, average 3.9 times. She  boluses 2-4 times per day, usually 3 times. Her average BG is 198, compared with 187 at her last visit and with 207 at the visit prior. BGs are still quite variable, with a range of 83 to >400, compared with 58 - 478 at her last visit and with 89 - >400 at her prior visit. BGs are surprisingly good for being without a sensor for two weeks. The basal rate changes we made at her last visit were successful. Most of her BGs >400 occurred when her sites were older than 3 days and were going bad.  7. Sensor printout: No data for the past two weeks  PAST MEDICAL, FAMILY, AND SOCIAL HISTORY:  Past Medical History:  Diagnosis Date  . Brain cyst   . DM type 1 with diabetic peripheral neuropathy (HCC)   . Goiter   . Hashimoto's disease   .  Hypertension   . Hypoglycemia associated with diabetes (HCC)   . Orthostatic hypotension   . Type 1 diabetes mellitus not at goal [facility])     Family History  Problem Relation Age of Onset  . Thyroid disease Maternal Aunt        Hypothyroid, takes thyroid medicine  . Thyroid disease Maternal Grandmother        Sluggish thyroid  . Cancer Paternal Grandfather   . Diabetes Cousin        T1 DM     Current Outpatient Medications:  .  albuterol (PROVENTIL HFA) 108 (90 Base) MCG/ACT inhaler, Inhale into the lungs., Disp: , Rfl:  .  Bisacodyl (DULCOLAX PO), Take by mouth., Disp: , Rfl:  .  dicyclomine (BENTYL) 20 MG tablet, Take 20 mg by mouth every 6 (six) hours., Disp: , Rfl:  .  fluticasone (FLONASE) 50 MCG/ACT nasal spray, 1 spray by Each Nare route daily., Disp: , Rfl:  .  glucose blood (BAYER CONTOUR TEST) test strip, Check blood sugar 10x day (Use with insulin pump), Disp: 300 each, Rfl: 12 .  HUMALOG 100 UNIT/ML injection, USE 300 UNITS EVERY 48  HOURS IN INSULIN PUMP, Disp: 140 mL, Rfl: 4 .  lisinopril (PRINIVIL,ZESTRIL) 5 MG tablet, TAKE 1 TABLET BY MOUTH  DAILY, Disp: 90 tablet, Rfl: 5 .  Multiple Vitamin (MULTIVITAMIN) capsule, Take by mouth., Disp: ,  Rfl:  .  Norethindrone-Ethinyl Estradiol-Fe Biphas (LO LOESTRIN FE) 1 MG-10 MCG / 10 MCG tablet, Take by mouth., Disp: , Rfl:  .  pravastatin (PRAVACHOL) 10 MG tablet, Take 1 tablet (10 mg total) by mouth every evening., Disp: 30 tablet, Rfl: 5 .  loratadine (CLARITIN) 10 MG tablet, Take 10 mg by mouth daily., Disp: , Rfl:   Allergies as of 02/16/2018 - Review Complete 02/16/2018  Allergen Reaction Noted  . Zithromax [azithromycin] Hives 08/21/2010  . Raspberry  09/27/2013    1. Work and Family: She graduated from [employer] in 2014. 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 She is taking some classes at [employer] in preparation for applying to grad school to start in the Fall of 2020 for a PhD in microbiology.  2. Activities: She has resumed playing oboe in an orchestra. She is trying to work less and do more things that she enjoys. She is trying to achieve a better balance and quality of life. Victoria Atkinson has been walking more.    3. Smoking, alcohol, or drugs: None 4. Primary Care Provider: Ms Zoe Lan, NP,  and Dr. Leavy Cella at [facility] has departed.   REVIEW OF SYSTEMS: She is allergic to raspberries and poison ivy. There are no other significant problems involving Victoria Atkinson's other body systems.   Objective:  Vital Signs:  BP 108/68   Pulse 72   Ht 5' 6.81" (1.697 m)   Wt 178 lb 9.6 oz (81 kg)   LMP 02/12/2018 (Approximate)   BMI 28.13 kg/m      Ht Readings from Last 3 Encounters:  02/16/18 5' 6.81" (1.697 m)  11/12/17 5\' 7"  (1.702 m)  10/01/17 5' 7.13" (1.705 m)   Wt Readings from Last 3 Encounters:  02/16/18 178 lb 9.6 oz (81 kg)  11/12/17 180 lb 6.4 oz (81.8 kg)  10/01/17 182 lb 9.6 oz (82.8 kg)  Facility age limit for growth percentiles is 20 years. Facility age limit for growth percentiles is 20 years.  PHYSICAL EXAM:  Constitutional: The patient appears healthy, but overweight. Her weight decreased 2 pounds.  Her Ideal Body Weight is 135 pounds. She is very bright, personable, and mature. She does not have a scale at home because she does not like to weigh herself. Face: The face appears normal.  Eyes: There is no obvious arcus or proptosis. Moisture appears normal. Mouth: The oropharynx is normal. Her tongue has no spots of geographic tongue today. Oral moisture is normal. Neck: The neck appears to be visibly normal. No carotid bruits are noted. The thyroid gland is again enlarged at about 22 grams in size. Today the right lobe is larger and the left lobe is smaller. The consistency of the thyroid gland is normal. The thyroid gland is not tender to palpation. Lungs: The lungs are clear to auscultation. Air movement is good. Heart: Heart rate and rhythm are regular. Heart sounds S1 and S2 are normal. I did not appreciate any pathologic cardiac murmurs. Abdomen: The abdomen is enlarged. Bowel sounds are normal. There is no obvious hepatomegaly, splenomegaly, or other mass effect.  Arms: Muscle size and bulk are normal for age. Hands: There is no obvious tremor. Phalangeal and metacarpophalangeal joints are normal. Palmar muscles are normal. Palmar skin is normal. Palmar moisture is also normal. Legs: Muscles appear normal for age. No edema is present. Feet: Feet are normally formed. Dorsalis pedal pulses are faint 1+ on the right and 1+ on the left. Neurologic: Strength is normal for age in both the upper and lower extremities. Muscle tone is normal. Sensation to touch is normal in both the legs and feet.   LAB DATA:   Labs 02/16/18: HbA1c 7.1%, CBG 122  Labs 11/24/17: TSH 1.950, free T4 1.28, free T3 3.3; CMP normal except for glucose of 223; cholesterol 196, triglycerides 87, HDL 59, LDL 120; urinary microalbumin/creatinine ratio 3.9  Labs 11/12/17: CBG 274  Labs 10/01/17: CBG 164  Labs 09/01/17: HbA1c 7.3%, CBG 209  Labs 05/21/17: HbA1c 7.8%, CBG 315  Labs 02/17/17: HbA1c 7.2%, CBG 155  Labs  10/09/16: HbA1c 7.1%, CBG 61 prior to a snack  Labs 07/01/16: HbA1c 7.2%  Labs 06/27/16: TSH 1.15, free T4 1.39, free T3 2.8; CMP normal except for glucose 263; cholesterol 214, triglycerides 110, HDL 69. LDL 123; microalbumin/creatinine ratio <1.9  Labs 03/28/16: HbA1c 7.6%  Labs 12/26/15: HbA1c 8.1% today   Labs 09/20/15: HbA1c 7.6%  Labs 02/21/15: HbA1c 7.6%.   Labs 11/16/14: HbA1c 7.1%  Labs 08/04/14: HbA1c 7.5%, CMP normal except for glucose of 201; urinary microalbumin/creatinine ratio 3.3; TSH 2.937, free T4 1.08, free T3 3.1; cholesterol 197, triglycerides 137, HDL 58, and LDL 112  Labs 1/61/09: Hemoglobin A1c was 7.9%, compared with 7.5% at last visit and with 6.9% at the visit prior.    Labs 01/25/14: HbA1c 8.4%; CMP normal except for glucose of 178; cholesterol 225, triglycerides 276, HDL 77, LDL 92; microalbumin/creatinine ratio 2.6; TSH 2.597, free T4 1.06, free T3 2.9  Labs: 06/22/12: Cholesterol 236, triglycerides 300, HDL 68, LDL, 108 (pre-gluten-free diet); TTG IgA 6.4 (<20); IgA 152 (69-380); urinary microalbumin/creatinine ratio 3.1  Labs: 12/25/11: TSH 1.259, free T4 1.26, free T3 3.2; CMP normal          Assessment and Plan:   ASSESSMENT:  1-2. T1DM/hypoglycemia.:    A. Her HbA1c was lower in April 2019, but at the cost of having many SGs <70.   B. When she was in auto mode in late April and early May, her SGs and  BGs correlated well and her SGs and BGs were much better controlled and much less variable. When she was in auto mode in July the BGs were lower and more stable. Her new sensor and pump were doing a very good job of identifying and preventing hypoglycemic events and suspending the pump temporarily.    D. After increasing her ICRs at her last visit, her BGs were much lower, without at the same time causing much hypoglycemia.   E. Her lowest BG recently was 83.  3. Hypertension: Her BP is better today. She needs to exercise for at least 4 hours per week and  continue her lisinopril. 4-6. Goiter/thyroiditis/transient hypothyroidism:   A. Her thyroid gland is still enlarged at the same overall size, but the size of the lobes has shifted yet again. The pattern of waxing and waning of thyroid gland size is c/w evolving Hashimoto's Dz.   B. Her TFTs in March 2016 were at the low end of the normal range.  The fact that all three of her TFTs increased from September 2015 to March 2016 was pathognomonic for an interim flare up of Hashimoto's Dz. Her TFTs in February 2018 were well within the goal TSH range of 1.0-2.0.  Her TFTs in July 2019 were again within the goal range.  7. Peripheral neuropathy: The neuropathy is not evident today.  8. Dizziness/orthostatic hypotension: Her symptoms have resolved.   9-10. Autonomic neuropathy and tachycardia: Her autonomic neuropathy and tachycardia have improved.  11. Geographic tongue: She has no spots of geographic tongue today. She still needs to take her MVI daily. I suspect that when she has osmotic diuresis she is losing more B vitamins and minerals.  12. Hypercholesterolemia: We need to repeat her fasting lipids to see the effect of her pravastatin dosage. Marland Kitchen     PLAN: 1. Diagnostic: We reviewed her CBG today. We also reviewed her BG download. Obtain fasting lipid panel and CMP soon.   2. Therapeutic: Use her sensor. Continue lisinopril at 5 mg/day. Check BGs at bedtime. Continue exercising.     A. Continue basal rates:  MN: 0.800 4 AM: 1.300 9 AM: 0.325 11 AM: 0.650 6 PM:  0.650 10 PM: 0.400  B. Continue ICRs: MN: 12 6 Am: 7 11 Am: 7  C. Continue ISFs: MN: 50 6 Am: 30 11 AM: 45  D. Continue Targets: MN: 140 6 AM: 120 9 PM: 140 3. Patient education: We discussed issues of hyperglycemia, hypoglycemia, thyroiditis, weight gain, dehydration, and hypertension at length.  4. Follow-up: 3 months  Level of Service: This visit lasted in excess of 65 minutes. More than 50% of the visit was devoted to  counseling.  Molli Knock, MD, CDE Adult and Pediatric Endocrinology 02/16/2018 3:07 PM

## 2018-05-19 ENCOUNTER — Ambulatory Visit (INDEPENDENT_AMBULATORY_CARE_PROVIDER_SITE_OTHER): Payer: BLUE CROSS/BLUE SHIELD | Admitting: "Endocrinology

## 2018-05-20 ENCOUNTER — Ambulatory Visit (INDEPENDENT_AMBULATORY_CARE_PROVIDER_SITE_OTHER): Payer: BLUE CROSS/BLUE SHIELD | Admitting: "Endocrinology

## 2018-05-31 ENCOUNTER — Other Ambulatory Visit (INDEPENDENT_AMBULATORY_CARE_PROVIDER_SITE_OTHER): Payer: Self-pay | Admitting: "Endocrinology

## 2018-07-06 ENCOUNTER — Ambulatory Visit (INDEPENDENT_AMBULATORY_CARE_PROVIDER_SITE_OTHER): Payer: Managed Care, Other (non HMO) | Admitting: "Endocrinology

## 2018-07-06 VITALS — BP 118/68 | HR 64 | Wt 179.8 lb

## 2018-07-06 DIAGNOSIS — E1042 Type 1 diabetes mellitus with diabetic polyneuropathy: Secondary | ICD-10-CM

## 2018-07-06 DIAGNOSIS — E10649 Type 1 diabetes mellitus with hypoglycemia without coma: Secondary | ICD-10-CM

## 2018-07-06 DIAGNOSIS — K141 Geographic tongue: Secondary | ICD-10-CM

## 2018-07-06 DIAGNOSIS — E049 Nontoxic goiter, unspecified: Secondary | ICD-10-CM

## 2018-07-06 DIAGNOSIS — K3184 Gastroparesis: Secondary | ICD-10-CM

## 2018-07-06 DIAGNOSIS — I4711 Inappropriate sinus tachycardia, so stated: Secondary | ICD-10-CM

## 2018-07-06 DIAGNOSIS — E063 Autoimmune thyroiditis: Secondary | ICD-10-CM

## 2018-07-06 DIAGNOSIS — E1065 Type 1 diabetes mellitus with hyperglycemia: Secondary | ICD-10-CM | POA: Diagnosis not present

## 2018-07-06 DIAGNOSIS — I10 Essential (primary) hypertension: Secondary | ICD-10-CM

## 2018-07-06 DIAGNOSIS — E1043 Type 1 diabetes mellitus with diabetic autonomic (poly)neuropathy: Secondary | ICD-10-CM

## 2018-07-06 DIAGNOSIS — E7800 Pure hypercholesterolemia, unspecified: Secondary | ICD-10-CM

## 2018-07-06 DIAGNOSIS — IMO0001 Reserved for inherently not codable concepts without codable children: Secondary | ICD-10-CM

## 2018-07-06 DIAGNOSIS — R000 Tachycardia, unspecified: Secondary | ICD-10-CM

## 2018-07-06 LAB — POCT GLUCOSE (DEVICE FOR HOME USE): POC Glucose: 118 mg/dl — AB (ref 70–99)

## 2018-07-06 LAB — POCT GLYCOSYLATED HEMOGLOBIN (HGB A1C): Hemoglobin A1C: 7.3 % — AB (ref 4.0–5.6)

## 2018-07-06 NOTE — Patient Instructions (Signed)
Follow up visit in 3 months. Please obtain fasting lab tests.  

## 2018-07-06 NOTE — Progress Notes (Signed)
Subjective:  Patient Name: Victoria Atkinson Date of Birth: Sep 13, 1990  MRN: [id number]  Victoria Atkinson  presents to the office today for follow-up evaluation and management of her type 1 diabetes mellitus, goiter, hypoglycemia, peripheral neuropathy, autonomic neuropathy with inappropriate sinus tachycardia, migraine headaches, goiter, Hashimoto's disease, geographic tongue, obesity, and hypertension.  HISTORY OF PRESENT ILLNESS:   Victoria Atkinson is a 28 y.o. Caucasian young woman.  [name] was unaccompanied.   1. The patient was first referred to me on 10/31/04 by Dr. Crista Luria from [facility] for evaluation and management of new-onset type 1 diabetes mellitus. The patient was 28 years old.  A. The patient had had about a 2 month period of polyuria, polydipsia, nocturia, upset stomach, nausea, stomach pains, and a documented 17 pound weight loss. She was often dizzy and lightheaded. At her PCP's office a serum glucose was 357. A serum CO2 was 26. I arranged to see the patient that day. 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 Family history was positive for a cousin with type 1 diabetes mellitus, a maternal grandmother with a "sluggish" thyroid, and a maternal aunt who took thyroid medicine. On physical examination her height was 65-1/2 inches. She weighed 99-1/2 pounds. Her BMI was 16.4. Her CBG was High, which was greater than 500 on our meter. Her hemoglobin A1c was 12.6%.  B. We provided diabetes education to the patient and her family in our office. I started her on a Novolog sliding scale regimen. I subsequently changed the Novolog to our two-component method with a correction dose and food dose at mealtimes. I also added Lantus insulin.  2. During the past thirteen years, the patient has done fairly well overall.   A. She had a good honeymoon period that lasted about 9 months, during which her hemoglobin A1c's varied from 6.0-6.3%. On 08/23/2005 we started  her on insulin pump therapy with a Medtronic Paradigm 722 insulin pump. Since then, her hemoglobin A1c values have varied from 6.5-8.3%. She converted to a Medtronic 530G pump in the spring of 2015. She did not like the Enlite sensor, so did not use it much.  B. On 10/21/2005, as part of an evaluation for headaches, an MRI was performed. A 3.1 x 6.2 x 11.0 cm posterior fossa arachnoid cyst was noted. Subsequent evaluation by neurosurgeons at [facility] and at [facility] felt that she had a mega cisterna magna rather than a cyst. This was considered to be a normal variant and was not felt to be causing her headaches. She was told that surgery would not be required.  C. She started her new Medtronic 670G insulin pump and Guardian 3 sensor on 08/04/17 and started in auto mode on 09/01/17. Since then Her HbA1c values have varied from 7.1-7.3%.   4. The patient's last [facility] visit was on 02/16/18. At that visit I continued her lisinopril. I continued her pump settings. I also continued her pravastatin, 10 mg/day.   A. In the interim, she has been healthy except for skin problems:    1). She is now taking allergy shots. She is also using Xyzal and Singulair.    2). Her hives are much better.     3). She has had some blistering reactions at her sensor sites. She called Gearldine Bienenstock, our diabetes educator, who gave her several options of different adhesives to try, but Angelie says those options did not work.    B. She has  not had any recent migraine HAs.   C. Her allergic conjunctivitis has been better.      D. She is still following the gluten-free and dairy-free diet. She still takes some non-milk dairy products.   E. She likes her new Medtronic 670G insulin pump and Guardian 3 sensor, but she has not been using her CGM due to skin rashes. She does a lot better about checking BGs and staying in auto mode when she wears the sensor.    F. She uses Humalog lispro in her Medtronic 670G  pump.  She still takes lisinopril, 5 mg/day and pravastatin, 10 mg/day. She has not had any coughing problems or muscle problems.  She is no longer taking her MVI due to forgetfulness.    G. She now carries glucose tabs with her. She has juice at work and both juice and glucose tablets at home.   H. She has not been walking much on the weekends.    5. Pertinent Review of Systems:  Constitutional: The patient feels "pretty good". She has been healthy. Her allergies are "better". Her pet hedgehog will have surgery tomorrow for a chest mass. .      Eyes: As above. Vision is good with her glasses. Her last eye exam was in about July 2019. She had no signs of diabetic eye disease. She no longer has any pain in the lateral aspects of her left eye if she turned her eyes too rapidly to the right.  Mouth: Her oropharynx is normal. She has no areas of geographic tongue at this time.  Neck: The patient has not had any recent anterior neck soreness and pressure sensations.  Heart: She has not had any heart racing at rest despite resuming caffeine use. Heart rate increases with exercise or other physical activity. The patient has no complaints of palpitations, irregular heat beats, chest pain, or chest pressure. Gastrointestinal: She has not had much postprandial bloating. She has not had any acid reflux recently. Her IBS is "pretty good". She does pretty well as long as she avoids dairy. She has not been constipated. She has no complaints of excessive hunger, upset stomach, stomach aches or other pains. Legs: Muscle mass and strength seem normal. There are no complaints of numbness, tingling, burning, or pain. No edema is noted. Feet: There are no obvious foot problems. There are no complaints of numbness, tingling, burning, or pain. No edema is noted. GYN: She remains on oral contraceptive pills. Her LMP was last week.    Hypoglycemia: Not many      6. BG printout: She changes her sites every 1-4 days, mostly  every 3 days. She checks BGs 3-4 times per day, average 3.8 times. She boluses 2-4 times per day, usually 3 times. Her average BG is 228, compared with 198 at her last visit and with 187 at the visit prior. BGs are still quite variable, with a range of 92 to >400, compared with 83 to >400 at her last visit and with 58 - 478 at her prior visit. BGs are higher after being without a sensor for four months.  Most of her BGs >400 occurred when her sites were older than 3 days, when she did not bolus enough, or when she forgot to bolus.   7. Sensor printout: No data   PAST MEDICAL, FAMILY, AND SOCIAL HISTORY:  Past Medical History:  Diagnosis Date  . Brain cyst   . DM type 1 with diabetic peripheral neuropathy (HCC)   .  Goiter   . Hashimoto's disease   . Hypertension   . Hypoglycemia associated with diabetes (HCC)   . Orthostatic hypotension   . Type 1 diabetes mellitus not at goal [facility])     Family History  Problem Relation Age of Onset  . Thyroid disease Maternal Aunt        Hypothyroid, takes thyroid medicine  . Thyroid disease Maternal Grandmother        Sluggish thyroid  . Cancer Paternal Grandfather   . Diabetes Cousin        T1 DM     Current Outpatient Medications:  .  albuterol (PROVENTIL HFA) 108 (90 Base) MCG/ACT inhaler, Inhale into the lungs., Disp: , Rfl:  .  azelastine (ASTELIN) 0.1 % nasal spray, PLACE 1-2 PUFFS IN EACH NOSTRIL TWICE A DAY NASALLY 30 DAY(S), Disp: , Rfl:  .  dicyclomine (BENTYL) 20 MG tablet, Take 20 mg by mouth every 6 (six) hours., Disp: , Rfl:  .  EPINEPHrine 0.3 mg/0.3 mL IJ SOAJ injection, USE AS DIRECTED AS NEEDED FOR SYSTEMIC REACTIONS INJECTION 30 DAYS, Disp: , Rfl:  .  glucose blood (BAYER CONTOUR TEST) test strip, Check blood sugar 10x day (Use with insulin pump), Disp: 300 each, Rfl: 12 .  HUMALOG 100 UNIT/ML injection, USE 300 UNITS EVERY 48  HOURS IN INSULIN PUMP, Disp: 140 mL, Rfl: 4 .  JUNEL FE 1/20 1-20 MG-MCG tablet, , Disp: , Rfl:  .   levocetirizine (XYZAL) 5 MG tablet, , Disp: , Rfl:  .  lisinopril (PRINIVIL,ZESTRIL) 5 MG tablet, TAKE 1 TABLET BY MOUTH  DAILY, Disp: 90 tablet, Rfl: 5 .  loratadine (CLARITIN) 10 MG tablet, Take 10 mg by mouth daily., Disp: , Rfl:  .  montelukast (SINGULAIR) 10 MG tablet, , Disp: , Rfl:  .  Multiple Vitamin (MULTIVITAMIN) capsule, Take by mouth., Disp: , Rfl:  .  Norethindrone-Ethinyl Estradiol-Fe Biphas (LO LOESTRIN FE) 1 MG-10 MCG / 10 MCG tablet, Take by mouth., Disp: , Rfl:  .  pravastatin (PRAVACHOL) 10 MG tablet, TAKE 1 TABLET BY MOUTH  EVERY EVENING, Disp: 90 tablet, Rfl: 1 .  PROAIR RESPICLICK 108 (90 Base) MCG/ACT AEPB, INHALE 1-2 PUFFS AS NEEDED EVERY 4-6 HRS PRN FOR COUGHING OR WHEEZING INHALATION, Disp: , Rfl:  .  valACYclovir (VALTREX) 500 MG tablet, TAKE 2 TABLETS BY MOUTH TWICE A DAY FOR 2 DAYS AS NEEDED, Disp: , Rfl:  .  Bisacodyl (DULCOLAX PO), Take by mouth., Disp: , Rfl:  .  fluticasone (FLONASE) 50 MCG/ACT nasal spray, 1 spray by Each Nare route daily., Disp: , Rfl:   Allergies as of 07/06/2018 - Review Complete 02/16/2018  Allergen Reaction Noted  . Zithromax [azithromycin] Hives 08/21/2010  . Raspberry  09/27/2013    1. Work and Family: She graduated from [employer] in 2014. 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  She is not sure if she wants to pursue her PhD.   2. 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 She is trying to work less, to do more things that she enjoys, and to achieve a better balance and quality of life. Daija has been walking more. She recently started going to a gym. 3. Smoking, alcohol, or drugs: None 4. Primary Care Provider: Ms Zoe Lan, NP,  and Dr. Leavy Cella at [facility] has departed.   REVIEW OF SYSTEMS: She is allergic  to raspberries and poison ivy. There are no other significant problems involving Javaya's other  body systems.   Objective:  Vital Signs:  BP 118/68   Pulse 64   Wt 179 lb 12.8 oz (81.6 kg)   LMP 06/29/2018 (Approximate)   BMI 28.32 kg/m      Ht Readings from Last 3 Encounters:  02/16/18 5' 6.81" (1.697 m)  11/12/17  (1.702 m)  10/01/17 5' 7.13" (1.705 m)   Wt Readings from Last 3 Encounters:  07/06/18 179 lb 12.8 oz (81.6 kg)  02/16/18 178 lb 9.6 oz (81 kg)  11/12/17 180 lb 6.4 oz (81.8 kg)  Facility age limit for growth percentiles is 20 years. Facility age limit for growth percentiles is 20 years.  PHYSICAL EXAM:  Constitutional: The patient appears healthy, but overweight. Her weight increased 1-1/4 pounds. Her Ideal Body Weight is 135 pounds. She is very bright, personable, and mature. She does not have a scale at home because she does not like to weigh herself. Face: The face appears normal.  Eyes: There is no obvious arcus or proptosis. Moisture appears normal. Mouth: The oropharynx is normal. Her tongue has no spots of geographic tongue today. Oral moisture is normal. Neck: The neck appears to be visibly normal. No carotid bruits are noted. The thyroid gland is again enlarged, but smaller, at about 21 grams in size. Today the lobes are fairly symmetrically enlarged. The consistency of the thyroid gland is normal. The thyroid gland is not tender to palpation. Lungs: The lungs are clear to auscultation. Air movement is good. Heart: Heart rate and rhythm are regular. Heart sounds S1 and S2 are normal. I did not appreciate any pathologic cardiac murmurs. Abdomen: The abdomen is enlarged. Bowel sounds are normal. There is no obvious hepatomegaly, splenomegaly, or other mass effect.  Arms: Muscle size and bulk are normal for age. Hands: There is no obvious tremor. Phalangeal and metacarpophalangeal joints are normal. Palmar muscles are normal. Palmar skin is normal. Palmar moisture is also normal. Legs: Muscles appear normal for age. No edema is present. Feet: Feet  are normally formed. Dorsalis pedal pulses are 1+ on the right and 1+ on the left. Neurologic: Strength is normal for age in both the upper and lower extremities. Muscle tone is normal. Sensation to touch is normal in both the legs and feet.   LAB DATA:   Labs 07/06/18: HbA1c 7.3%, CBG 118  Labs 02/16/18: HbA1c 7.1%, CBG 122;   Labs 11/24/17: TSH 1.950, free T4 1.28, free T3 3.3; CMP normal except for glucose of 223; cholesterol 196, triglycerides 87, HDL 59, LDL 120; urinary microalbumin/creatinine ratio 3.9  Labs 11/12/17: CBG 274  Labs 10/01/17: CBG 164  Labs 09/01/17: HbA1c 7.3%, CBG 209  Labs 05/21/17: HbA1c 7.8%, CBG 315  Labs 02/17/17: HbA1c 7.2%, CBG 155  Labs 10/09/16: HbA1c 7.1%, CBG 61 prior to a snack  Labs 07/01/16: HbA1c 7.2%  Labs 06/27/16: TSH 1.15, free T4 1.39, free T3 2.8; CMP normal except for glucose 263; cholesterol 214, triglycerides 110, HDL 69. LDL 123; microalbumin/creatinine ratio <1.9  Labs 03/28/16: HbA1c 7.6%  Labs 12/26/15: HbA1c 8.1% today   Labs 09/20/15: HbA1c 7.6%  Labs 02/21/15: HbA1c 7.6%.   Labs 11/16/14: HbA1c 7.1%  Labs 08/04/14: HbA1c 7.5%, CMP normal except for glucose of 201; urinary microalbumin/creatinine ratio 3.3; TSH 2.937, free T4 1.08, free T3 3.1; cholesterol 197, triglycerides 137, HDL 58, and LDL 112  Labs 1/61/09: Hemoglobin A1c was 7.9%, compared with 7.5%  at last visit and with 6.9% at the visit prior.    Labs 01/25/14: HbA1c 8.4%; CMP normal except for glucose of 178; cholesterol 225, triglycerides 276, HDL 77, LDL 92; microalbumin/creatinine ratio 2.6; TSH 2.597, free T4 1.06, free T3 2.9  Labs: 06/22/12: Cholesterol 236, triglycerides 300, HDL 68, LDL, 108 (pre-gluten-free diet); TTG IgA 6.4 (<20); IgA 152 (69-380); urinary microalbumin/creatinine ratio 3.1  Labs: 12/25/11: TSH 1.259, free T4 1.26, free T3 3.2; CMP normal          Assessment and Plan:   ASSESSMENT:  1-2. T1DM/hypoglycemia.:    A. Her HbA1c was lower in  October 2019 when she had been using her CGM for all but the 2 weeks immediately preceding that visit. Her HbA1c is higher now after four months of not using her CGM  B. When she was in auto mode in late April and early May, her SGs and BGs correlated well and her SGs and BGs were much better controlled and much less variable. When she was in auto mode in July the BGs were lower and more stable. Her new sensor and pump were doing a very good job of identifying and preventing hypoglycemic events and suspending the pump temporarily.    C. Since her BGs are generally higher during the day, we need to increase her basal rates a bit.  D. Her lowest documented low BG recently was 90.  3. Hypertension: Her BP is better today. She needs to exercise for at least 4 hours per week and continue her lisinopril. 4-6. Goiter/thyroiditis/transient hypothyroidism:   A. Her thyroid gland is still enlarged, but smaller. The lobes have also shifted in size again.  The pattern of waxing and waning of thyroid gland size is c/w evolving Hashimoto's Dz.   B. Her TFTs in March 2016 were at the low end of the normal range.  The fact that all three of her TFTs increased from September 2015 to March 2016 was pathognomonic for an interim flare up of Hashimoto's Dz. Her TFTs in February 2018 were well within the goal TSH range of 1.0-2.0.  Her TFTs in July 2019 were again within the goal range.  7. Peripheral neuropathy: The neuropathy is not evident today.  8. Dizziness/orthostatic hypotension: Her symptoms have resolved.   9-10. Autonomic neuropathy and tachycardia: Her autonomic neuropathy and tachycardia have improved.  11. Geographic tongue: She has more spots of geographic tongue today. She still needs to take her MVI daily. I suspect that when she has osmotic diuresis she is losing more B vitamins and minerals.  12. Hypercholesterolemia: We need to repeat her fasting lipids to see the effect of her pravastatin dosage. Marland Kitchen      PLAN: 1. Diagnostic: We reviewed her CBG today. We also reviewed her BG download. Obtain fasting lipid panel and CMP soon.   2. Therapeutic: Use her sensor. Continue lisinopril at 5 mg/day and pravastatin at 10 mg/day. . Check BGs at bedtime. Continue exercising.     A. New basal rates:  MN: 0.800 4 AM: 1.300 -> 1.400 9 AM: 0.325 -> 0.450 11 AM: 0.650 > 0.700 6 PM:  0.650  10 PM: 0.400 -> 0.450  B. Continue ICRs: MN: 12 6 Am: 7 11 Am: 7  C. Continue ISFs: MN: 50 6 Am: 30 11 AM: 45  D. Continue Targets: MN: 140 6 AM: 120 9 PM: 140 3. Patient education: We discussed issues of hyperglycemia, hypoglycemia, thyroiditis, weight gain, dehydration, and hypertension at length.  4.  Follow-up: 3 months  Level of Service: This visit lasted in excess of 60 minutes. More than 50% of the visit was devoted to counseling.  Molli Knock, MD, CDE Adult and Pediatric Endocrinology 07/06/2018 1:37 PM

## 2018-08-01 ENCOUNTER — Other Ambulatory Visit (INDEPENDENT_AMBULATORY_CARE_PROVIDER_SITE_OTHER): Payer: Self-pay | Admitting: "Endocrinology

## 2018-08-01 DIAGNOSIS — E1065 Type 1 diabetes mellitus with hyperglycemia: Principal | ICD-10-CM

## 2018-08-01 DIAGNOSIS — IMO0001 Reserved for inherently not codable concepts without codable children: Secondary | ICD-10-CM

## 2018-10-13 ENCOUNTER — Encounter (INDEPENDENT_AMBULATORY_CARE_PROVIDER_SITE_OTHER): Payer: Self-pay | Admitting: "Endocrinology

## 2018-10-13 ENCOUNTER — Ambulatory Visit (INDEPENDENT_AMBULATORY_CARE_PROVIDER_SITE_OTHER): Payer: Managed Care, Other (non HMO) | Admitting: "Endocrinology

## 2018-10-13 ENCOUNTER — Other Ambulatory Visit: Payer: Self-pay

## 2018-10-13 VITALS — BP 122/70 | HR 100 | Wt 177.6 lb

## 2018-10-13 DIAGNOSIS — E1065 Type 1 diabetes mellitus with hyperglycemia: Secondary | ICD-10-CM | POA: Diagnosis not present

## 2018-10-13 DIAGNOSIS — E11649 Type 2 diabetes mellitus with hypoglycemia without coma: Secondary | ICD-10-CM | POA: Diagnosis not present

## 2018-10-13 DIAGNOSIS — IMO0001 Reserved for inherently not codable concepts without codable children: Secondary | ICD-10-CM

## 2018-10-13 DIAGNOSIS — R000 Tachycardia, unspecified: Secondary | ICD-10-CM

## 2018-10-13 DIAGNOSIS — E1042 Type 1 diabetes mellitus with diabetic polyneuropathy: Secondary | ICD-10-CM

## 2018-10-13 DIAGNOSIS — E049 Nontoxic goiter, unspecified: Secondary | ICD-10-CM

## 2018-10-13 DIAGNOSIS — K141 Geographic tongue: Secondary | ICD-10-CM

## 2018-10-13 DIAGNOSIS — I10 Essential (primary) hypertension: Secondary | ICD-10-CM

## 2018-10-13 DIAGNOSIS — E7800 Pure hypercholesterolemia, unspecified: Secondary | ICD-10-CM

## 2018-10-13 DIAGNOSIS — E1043 Type 1 diabetes mellitus with diabetic autonomic (poly)neuropathy: Secondary | ICD-10-CM | POA: Diagnosis not present

## 2018-10-13 LAB — POCT GLUCOSE (DEVICE FOR HOME USE): POC Glucose: 248 mg/dl — AB (ref 70–99)

## 2018-10-13 LAB — POCT GLYCOSYLATED HEMOGLOBIN (HGB A1C): Hemoglobin A1C: 7.4 % — AB (ref 4.0–5.6)

## 2018-10-13 NOTE — Progress Notes (Signed)
Subjective:  Patient Name: Victoria Atkinson Date of Birth: 10/31/1990  MRN: [id number]  Victoria Atkinson  presents to the office today for follow-up evaluation and management of her type 1 diabetes mellitus, goiter, hypoglycemia, peripheral neuropathy, autonomic neuropathy with inappropriate sinus tachycardia, migraine headaches, goiter, Hashimoto's disease, geographic tongue, obesity, and hypertension.  HISTORY OF PRESENT ILLNESS:   Victoria Atkinson is a 28 y.o. Caucasian young woman.  Victoria Atkinson was unaccompanied.   1. The patient was first referred to me on 10/31/04 by Dr. Crista Luria from [facility] for evaluation and management of new-onset type 1 diabetes mellitus. The patient was 27 years old.  A. The patient had had about a 2 month period of polyuria, polydipsia, nocturia, upset stomach, nausea, stomach pains, and a documented 17 pound weight loss. She was often dizzy and lightheaded. At her PCP's office a serum glucose was 357. A serum CO2 was 26. I arranged to see the patient that day. 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 Family history was positive for a cousin with type 1 diabetes mellitus, a maternal grandmother with a "sluggish" thyroid, and a maternal aunt who took thyroid medicine. On physical examination her height was 65-1/2 inches. She weighed 99-1/2 pounds. Her BMI was 16.4. Her CBG was High, which was greater than 500 on our meter. Her hemoglobin A1c was 12.6%.  B. We provided diabetes education to the patient and her family in our office. I started her on a Novolog sliding scale regimen. I subsequently changed the Novolog to our two-component method with a correction dose and food dose at mealtimes. I also added Lantus insulin.  2. During the past fourteen years, the patient has done fairly well overall.   A. She had a good honeymoon period that lasted about 9 months, during which her hemoglobin A1c's varied from 6.0-6.3%. On 08/23/2005 we started  her on insulin pump therapy with a Medtronic Paradigm 722 insulin pump. Since then, her hemoglobin A1c values have varied from 6.5-8.3%. She converted to a Medtronic 530G pump in the spring of 2015. She did not like the Enlite sensor, so did not use it much.  B. On 10/21/2005, as part of an evaluation for headaches, an MRI was performed. A 3.1 x 6.2 x 11.0 cm posterior fossa arachnoid cyst was noted. Subsequent evaluation by neurosurgeons at [facility] and at [facility] felt that she had a mega cisterna magna rather than a cyst. This was considered to be a normal variant and was not felt to be causing her headaches. She was told that surgery would not be required.  C. She started her new Medtronic 670G insulin pump and Guardian 3 sensor on 08/04/17 and started in auto mode on 09/01/17. Since then Her HbA1c values have varied from 7.1-7.3%.   4. The patient's last [facility] visit was on 07/06/18. At that visit I continued her lisinopril, 5 mg/day and pravastatin, 10 mg/day. I increased most basal rate settings.  A. In the interim, she has been healthy except for skin problems:    1). She was switched to a second shift job in mid-March and her BGs increased . The BGs improved when she went back to first shift several weeks ago.     2). She is now taking allergy shots. She is also using Xyzal and Singulair.    3). Her hives are much better. Her allergic conjunctivitis has been better.      4). She is  trying new medications and adhesives to allow her to use her Guardian sensor again.   B. She had a "terrible" migraine on Sunday. Her left leg went numb then, but has normalized since then. She feels that the migraines are related to a particular birth control product.  She will see her GYN soon.    C.  She is still following the gluten-free and dairy-free diet. She still takes some non-milk dairy products.   D. She likes her Medtronic 670G insulin pump and Guardian 3 sensor when she can  use the sensor.     E. She uses Humalog lispro in her Medtronic 670G pump.  She still takes lisinopril, 5 mg/day and pravastatin, 10 mg/day. She has not had any coughing problems or muscle problems.  She is no longer taking her MVI due to forgetfulness.    F. She now carries glucose tabs with her. She has juice at work and both juice and glucose tablets at home.   G. She has been walking more often.     5. Pertinent Review of Systems:  Constitutional: The patient feels "pretty good". She has been healthy. Her allergies are "better". Her pet hedgehog has recovered from surgery.       Eyes: Vision is good with her glasses. Her last eye exam was in about July 2019. She had no signs of diabetic eye disease. She no longer has any pain in the lateral aspects of her left eye if she turned her eyes too rapidly to the right.  Mouth: Her oropharynx is normal. She has no areas of geographic tongue at this time.  Neck: The patient has not had any recent anterior neck soreness and pressure sensations.  Heart: She has not had any heart racing at rest despite resuming caffeine use. Heart rate increases with exercise or other physical activity. The patient has no complaints of palpitations, irregular heat beats, chest pain, or chest pressure. Gastrointestinal: She has not had any postprandial bloating or any acid reflux recently. Her IBS is "pretty good". She does pretty well as long as she avoids dairy. She has not been constipated. She has no complaints of excessive hunger, upset stomach, stomach aches or other pains. Legs: As above. Muscle mass and strength seem normal. There are no complaints of numbness, tingling, burning, or pain. No edema is noted. Feet: There are no obvious foot problems. There are no complaints of numbness, tingling, burning, or pain. No edema is noted. GYN: She remains on oral contraceptive pills. Her LMP was this past weekend when she had her migraine.     Hypoglycemia: Not many      6.  BG printout: She changes her sites every 2-3 days, mostly every 3 days. She checks BGs 2-5 times per day, average 4.2 times. She boluses 2-5 times per day, usually 3 times. Her average BG is 200, compared with 238 at her last visit and with 198 at the visit prior. BGs are still quite variable, with a range of 79-357, compared with 92 to >400 at her last visit and with 83 to >400 at her prior visit. BGs are higher when her sites do not work well.or when she takes in more carbs than she boluses for.    7. Sensor printout: She was in auto mode for one day. Average SG was 133. She was in zone 88% of the time while wearing the sensor.   PAST MEDICAL, FAMILY, AND SOCIAL HISTORY:  Past Medical History:  Diagnosis Date  .  Brain cyst   . DM type 1 with diabetic peripheral neuropathy (HCC)   . Goiter   . Hashimoto's disease   . Hypertension   . Hypoglycemia associated with diabetes (HCC)   . Orthostatic hypotension   . Type 1 diabetes mellitus not at goal [facility](HCC)     Family History  Problem Relation Age of Onset  . Thyroid disease Maternal Aunt        Hypothyroid, takes thyroid medicine  . Thyroid disease Maternal Grandmother        Sluggish thyroid  . Cancer Paternal Grandfather   . Diabetes Cousin        T1 DM     Current Outpatient Medications:  .  glucose blood (BAYER CONTOUR TEST) test strip, Check blood sugar 10x day (Use with insulin pump), Disp: 300 each, Rfl: 12 .  HUMALOG 100 UNIT/ML injection, USE 300 UNITS EVERY 48  HOURS IN INSULIN PUMP, Disp: 140 mL, Rfl: 4 .  JUNEL FE 1/20 1-20 MG-MCG tablet, , Disp: , Rfl:  .  levocetirizine (XYZAL) 5 MG tablet, , Disp: , Rfl:  .  lisinopril (PRINIVIL,ZESTRIL) 5 MG tablet, TAKE 1 TABLET BY MOUTH  DAILY, Disp: 90 tablet, Rfl: 5 .  montelukast (SINGULAIR) 10 MG tablet, , Disp: , Rfl:  .  pravastatin (PRAVACHOL) 10 MG tablet, TAKE 1 TABLET BY MOUTH  EVERY EVENING, Disp: 90 tablet, Rfl: 1 .  albuterol (PROVENTIL HFA) 108 (90 Base) MCG/ACT  inhaler, Inhale into the lungs., Disp: , Rfl:  .  azelastine (ASTELIN) 0.1 % nasal spray, PLACE 1-2 PUFFS IN EACH NOSTRIL TWICE A DAY NASALLY 30 DAY(S), Disp: , Rfl:  .  Bisacodyl (DULCOLAX PO), Take by mouth., Disp: , Rfl:  .  dicyclomine (BENTYL) 20 MG tablet, Take 20 mg by mouth every 6 (six) hours., Disp: , Rfl:  .  EPINEPHrine 0.3 mg/0.3 mL IJ SOAJ injection, USE AS DIRECTED AS NEEDED FOR SYSTEMIC REACTIONS INJECTION 30 DAYS, Disp: , Rfl:  .  fluticasone (FLONASE) 50 MCG/ACT nasal spray, 1 spray by Each Nare route daily., Disp: , Rfl:  .  loratadine (CLARITIN) 10 MG tablet, Take 10 mg by mouth daily., Disp: , Rfl:  .  Multiple Vitamin (MULTIVITAMIN) capsule, Take by mouth., Disp: , Rfl:  .  Norethindrone-Ethinyl Estradiol-Fe Biphas (LO LOESTRIN FE) 1 MG-10 MCG / 10 MCG tablet, Take by mouth., Disp: , Rfl:  .  PROAIR RESPICLICK 108 (90 Base) MCG/ACT AEPB, INHALE 1-2 PUFFS AS NEEDED EVERY 4-6 HRS PRN FOR COUGHING OR WHEEZING INHALATION, Disp: , Rfl:  .  valACYclovir (VALTREX) 500 MG tablet, TAKE 2 TABLETS BY MOUTH TWICE A DAY FOR 2 DAYS AS NEEDED, Disp: , Rfl:   Allergies as of 10/13/2018 - Review Complete 10/13/2018  Allergen Reaction Noted  . Zithromax [azithromycin] Hives 08/21/2010  . Raspberry  09/27/2013    1. Work and Family: She graduated from [employer] in 2014. 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  She is now interested in obtaining a master's degree rather than a PhD.   2. 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 She is trying to work less, to do more things that she enjoys, and to achieve a better balance and quality of life. Victoria Atkinson has been walking more.  3. Smoking, alcohol, or drugs: None 4. Primary Care Provider: Ms Zoe LanPenny Jones, NP,  [facility]   REVIEW OF SYSTEMS: She is allergic to raspberries and  poison ivy. There are no other significant problems involving Pluma's other body systems.   Objective:  Vital Signs:  BP 122/70   Pulse 100   Wt 177 lb 9.6 oz (80.6 kg)   BMI 27.97 kg/m      Ht Readings from Last 3 Encounters:  02/16/18 5' 6.81" (1.697 m)  11/12/17 5\' 7"  (1.702 m)  10/01/17 5' 7.13" (1.705 m)   Wt Readings from Last 3 Encounters:  10/13/18 177 lb 9.6 oz (80.6 kg)  07/06/18 179 lb 12.8 oz (81.6 kg)  02/16/18 178 lb 9.6 oz (81 kg)  Facility age limit for growth percentiles is 20 years. Facility age limit for growth percentiles is 20 years.  PHYSICAL EXAM:  Constitutional: The patient appears healthy, but overweight. Her weight decreased 2 pounds. Her Ideal Body Weight is 135 pounds. She is very bright, personable, and mature. She does not have a scale at home because she does not like to weigh herself. Face: The face appears normal.  Eyes: There is no obvious arcus or proptosis. Moisture appears normal. Mouth: The oropharynx is normal. Her tongue has no spots of geographic tongue today. Oral moisture is normal. Neck: The neck appears to be visibly normal. No carotid bruits are noted. The thyroid gland is again enlarged at about 21 grams in size. Today the lobes are symmetrically enlarged. The consistency of the thyroid gland is normal. The thyroid gland is not tender to palpation. Lungs: The lungs are clear to auscultation. Air movement is good. Heart: Heart rate and rhythm are regular. Heart sounds S1 and S2 are normal. I did not appreciate any pathologic cardiac murmurs. Abdomen: The abdomen is enlarged. Bowel sounds are normal. There is no obvious hepatomegaly, splenomegaly, or other mass effect.  Arms: Muscle size and bulk are normal for age. Hands: There is no obvious tremor. Phalangeal and metacarpophalangeal joints are normal. Palmar muscles are normal. Palmar skin is normal. Palmar moisture is also normal. Legs: Muscles appear normal for age. No edema is  present. Feet: Feet are normally formed. Dorsalis pedal pulses are 1+ on the right and 1+ on the left. Neurologic: Strength is normal for age in both the upper and lower extremities. Muscle tone is normal. Sensation to touch is normal in both the legs and feet.   LAB DATA:   Labs 10/13/18: HbA1c 7.4%, CBG 248  Labs 07/06/18: HbA1c 7.3%, CBG 118  Labs 02/16/18: HbA1c 7.1%, CBG 122;   Labs 11/24/17: TSH 1.950, free T4 1.28, free T3 3.3; CMP normal except for glucose of 223; cholesterol 196, triglycerides 87, HDL 59, LDL 120; urinary microalbumin/creatinine ratio 3.9  Labs 11/12/17: CBG 274  Labs 10/01/17: CBG 164  Labs 09/01/17: HbA1c 7.3%, CBG 209  Labs 05/21/17: HbA1c 7.8%, CBG 315  Labs 02/17/17: HbA1c 7.2%, CBG 155  Labs 10/09/16: HbA1c 7.1%, CBG 61 prior to a snack  Labs 07/01/16: HbA1c 7.2%  Labs 06/27/16: TSH 1.15, free T4 1.39, free T3 2.8; CMP normal except for glucose 263; cholesterol 214, triglycerides 110, HDL 69. LDL 123; microalbumin/creatinine ratio <1.9  Labs 03/28/16: HbA1c 7.6%  Labs 12/26/15: HbA1c 8.1% today   Labs 09/20/15: HbA1c 7.6%  Labs 02/21/15: HbA1c 7.6%.   Labs 11/16/14: HbA1c 7.1%  Labs 08/04/14: HbA1c 7.5%, CMP normal except for glucose of 201; urinary microalbumin/creatinine ratio 3.3; TSH 2.937, free T4 1.08, free T3 3.1; cholesterol 197, triglycerides 137, HDL 58, and  LDL 112  Labs 01/31/14: Hemoglobin A1c was 7.9%, compared with 7.5% at last visit and with 6.9% at the visit prior.    Labs 01/25/14: HbA1c 8.4%; CMP normal except for glucose of 178; cholesterol 225, triglycerides 276, HDL 77, LDL 92; microalbumin/creatinine ratio 2.6; TSH 2.597, free T4 1.06, free T3 2.9  Labs: 06/22/12: Cholesterol 236, triglycerides 300, HDL 68, LDL, 108 (pre-gluten-free diet); TTG IgA 6.4 (<20); IgA 152 (69-380); urinary microalbumin/creatinine ratio 3.1  Labs: 12/25/11: TSH 1.259, free T4 1.26, free T3 3.2; CMP normal          Assessment and Plan:    ASSESSMENT:  1-2. T1DM/hypoglycemia.:    A. When she was in auto mode in late April and early May 2019, her SGs and BGs correlated well and her SGs and BGs were much better controlled and much less variable. When she was in auto mode in July the BGs were lower and more stable. Her new sensor and pump were doing a very good job of identifying and preventing hypoglycemic events and suspending the pump temporarily. Her HbA1c was lower in October 2019 when she had been using her CGM for all but the 2 weeks immediately preceding that visit. However, her HbA1c was higher after four months of not using her CGM  B. Her HbA1c is higher today due largely to bring on the second shift for most of the past three months. Her BGs are better on the first shift.   C. When she uses her pump-sensor combination, her SGs are superb.   D. Her lowest documented BG recently was 79.  3. Hypertension: Her BP is better today. She needs to exercise for at least 4 hours per week and continue her lisinopril. 4-6. Goiter/thyroiditis/transient hypothyroidism:   A. Her thyroid gland is still enlarged. The pattern of waxing and waning of thyroid gland size is c/w evolving Hashimoto's Dz.   B. Her TFTs in March 2016 were at the low end of the normal range.  The fact that all three of her TFTs increased from September 2015 to March 2016 was pathognomonic for an interim flare up of Hashimoto's Dz. Her TFTs in February 2018 were well within the goal TSH range of 1.0-2.0.  Her TFTs in July 2019 were again within the goal range. She has not yet gone in for labs this year.  7. Peripheral neuropathy: The neuropathy is not evident today.  8-9:  Autonomic neuropathy and tachycardia: Her autonomic neuropathy and tachycardia have improved.  10. Geographic tongue: She has no spots of geographic tongue today. She still needs to take her MVI daily. I suspect that when she has osmotic diuresis she is losing more B vitamins and minerals.  11.  Hypercholesterolemia: We need to repeat her fasting lipids to see the effect of her pravastatin dosage. Marland Kitchen     PLAN: 1. Diagnostic: We reviewed her HbA1c, CBG, pump download and CCM download. Obtain annual surveillance lab tests soon.    2. Therapeutic: Use her sensor. Continue lisinopril at 5 mg/day and pravastatin at 10 mg/day. Check BGs at bedtime. Continue exercising.  Do everything possible to stay in auto mode.    A. Continue basal rates:  MN: 0.800 4 AM: 1.400 9 AM: 0.450 11 AM: 0.700 6 PM:  0.650  10 PM: 0.450  B. Continue ICRs: MN: 12 6 Am: 7 11 Am: 7  C. Continue ISFs: MN: 50 6 Am: 30 11 AM: 45  D. Continue Targets: MN: 140 6 AM: 120 9  PM: 140 3. Patient education: We discussed issues of hyperglycemia, hypoglycemia, thyroiditis, weight gain, dehydration, and hypertension at length.  4. Follow-up: 3 months  Level of Service: This visit lasted in excess of 55 minutes. More than 50% of the visit was devoted to counseling.  Molli Knock, MD, CDE Adult and Pediatric Endocrinology 10/13/2018 10:34 AM

## 2018-10-13 NOTE — Patient Instructions (Signed)
Follow up visit in 3 months. 

## 2018-10-14 ENCOUNTER — Other Ambulatory Visit (INDEPENDENT_AMBULATORY_CARE_PROVIDER_SITE_OTHER): Payer: Self-pay | Admitting: *Deleted

## 2018-10-14 DIAGNOSIS — IMO0001 Reserved for inherently not codable concepts without codable children: Secondary | ICD-10-CM

## 2018-10-14 MED ORDER — GLUCOSE BLOOD VI STRP: ORAL_STRIP | 1 refills | Status: DC

## 2018-10-15 ENCOUNTER — Ambulatory Visit (INDEPENDENT_AMBULATORY_CARE_PROVIDER_SITE_OTHER): Payer: Managed Care, Other (non HMO) | Admitting: "Endocrinology

## 2018-10-16 ENCOUNTER — Other Ambulatory Visit (INDEPENDENT_AMBULATORY_CARE_PROVIDER_SITE_OTHER): Payer: Self-pay | Admitting: "Endocrinology

## 2018-10-17 LAB — COMPREHENSIVE METABOLIC PANEL
ALT: 24 IU/L (ref 0–32)
AST: 20 IU/L (ref 0–40)
Albumin/Globulin Ratio: 1.7 (ref 1.2–2.2)
Albumin: 4.2 g/dL (ref 3.9–5.0)
Alkaline Phosphatase: 88 IU/L (ref 39–117)
BUN/Creatinine Ratio: 18 (ref 9–23)
BUN: 15 mg/dL (ref 6–20)
Bilirubin Total: 0.4 mg/dL (ref 0.0–1.2)
CO2: 23 mmol/L (ref 20–29)
Calcium: 9.3 mg/dL (ref 8.7–10.2)
Chloride: 102 mmol/L (ref 96–106)
Creatinine, Ser: 0.83 mg/dL (ref 0.57–1.00)
Globulin, Total: 2.5 g/dL (ref 1.5–4.5)
Glucose: 127 mg/dL — ABNORMAL HIGH (ref 65–99)
Potassium: 4.6 mmol/L (ref 3.5–5.2)
Sodium: 138 mmol/L (ref 134–144)
Total Protein: 6.7 g/dL (ref 6.0–8.5)

## 2018-10-17 LAB — MICROALBUMIN / CREATININE URINE RATIO
Creatinine, Urine: 150.9 mg/dL
Microalb/Creat Ratio: <2 mg/g creat (ref 0–29)
Microalbumin, Urine: <3 ug/mL

## 2018-10-17 LAB — LIPID PANEL W/O CHOL/HDL RATIO
Cholesterol, Total: 205 mg/dL — ABNORMAL HIGH (ref 100–199)
HDL: 71 mg/dL (ref >39)
LDL Calculated: 120 mg/dL — ABNORMAL HIGH (ref 0–99)
Triglycerides: 70 mg/dL (ref 0–149)
VLDL Cholesterol Cal: 14 mg/dL (ref 5–40)

## 2018-10-17 LAB — T3, FREE: T3, Free: 2.6 pg/mL (ref 2.0–4.4)

## 2018-10-17 LAB — T4, FREE: Free T4: 1.1 ng/dL (ref 0.82–1.77)

## 2018-10-17 LAB — TSH: TSH: 1.63 u[IU]/mL (ref 0.450–4.500)

## 2018-10-21 ENCOUNTER — Other Ambulatory Visit (INDEPENDENT_AMBULATORY_CARE_PROVIDER_SITE_OTHER): Payer: Self-pay | Admitting: *Deleted

## 2018-10-22 ENCOUNTER — Other Ambulatory Visit (INDEPENDENT_AMBULATORY_CARE_PROVIDER_SITE_OTHER): Payer: Self-pay | Admitting: *Deleted

## 2018-10-22 ENCOUNTER — Telehealth (INDEPENDENT_AMBULATORY_CARE_PROVIDER_SITE_OTHER): Payer: Self-pay | Admitting: "Endocrinology

## 2018-10-22 DIAGNOSIS — IMO0001 Reserved for inherently not codable concepts without codable children: Secondary | ICD-10-CM

## 2018-10-22 MED ORDER — GLUCOSE BLOOD VI STRP: ORAL_STRIP | 1 refills | Status: DC

## 2018-10-22 NOTE — Telephone Encounter (Signed)
° ° ° °  PRESCRIPTION REFILL ONLY  Name of prescription: [employer]ayer Contour test strips Pharmacy: [facility]

## 2018-10-22 NOTE — Telephone Encounter (Signed)
Sent refill as requested.

## 2018-10-23 ENCOUNTER — Telehealth (INDEPENDENT_AMBULATORY_CARE_PROVIDER_SITE_OTHER): Payer: Self-pay

## 2018-10-23 DIAGNOSIS — E7800 Pure hypercholesterolemia, unspecified: Secondary | ICD-10-CM

## 2018-10-23 MED ORDER — PRAVASTATIN SODIUM 20 MG PO TABS: 20.0000 mg | ORAL_TABLET | Freq: Every evening | ORAL | 5 refills | Status: AC

## 2018-10-23 NOTE — Telephone Encounter (Signed)
-----   Message from Sherrlyn Hock, MD sent at 10/22/2018 11:17 AM EDT ----- CMP was normal, except for a glucose of 127. Urinary microalbumin/creatinine ratio was normal at <2. Thyroid tests were mid-normal. Lipid panel showed an elevated cholesterol of 205 and an elevated LDL of 120. We need to double the pravastatin dosage to 20 mg each evening.  Clinical staff: Please send in a new prescription for pravastatin. Thanks. Dr. Tobe Sos

## 2018-10-26 ENCOUNTER — Other Ambulatory Visit (INDEPENDENT_AMBULATORY_CARE_PROVIDER_SITE_OTHER): Payer: Self-pay

## 2018-10-26 ENCOUNTER — Telehealth (INDEPENDENT_AMBULATORY_CARE_PROVIDER_SITE_OTHER): Payer: Self-pay | Admitting: "Endocrinology

## 2018-10-26 DIAGNOSIS — IMO0001 Reserved for inherently not codable concepts without codable children: Secondary | ICD-10-CM

## 2018-10-26 MED ORDER — GLUCOSE BLOOD VI STRP: ORAL_STRIP | 1 refills | Status: DC

## 2018-10-26 NOTE — Telephone Encounter (Signed)
A user error has taken place: encounter opened in error, closed for administrative reasons.

## 2018-10-26 NOTE — Telephone Encounter (Signed)
Prescription for Contour test strips sent out 10/22/2018. Contacted [facility] and they state they have not received the prescription. Will resend the RX to [facility] (this is confirmed the correct pharmacy with [facility]) and they will follow up in a day or two if they have not received it again.  If this happens a second time we will have to give a verbal Rx to ensure patient is able to get her test strips.

## 2018-10-26 NOTE — Telephone Encounter (Signed)
°  Who's calling (name and relationship to patient) : Melissa - [facility]   Best contact number: [phone]  Provider they see: Dr Tobe Sos    Reason for call: Needing an RX for the contour next test strip. Pharmacy has received a new RX yet     PRESCRIPTION REFILL ONLY  Name of prescription: Contour Next Test Strip    Pharmacy:  [facility]

## 2018-11-03 ENCOUNTER — Other Ambulatory Visit (INDEPENDENT_AMBULATORY_CARE_PROVIDER_SITE_OTHER): Payer: Self-pay | Admitting: *Deleted

## 2018-11-03 ENCOUNTER — Telehealth (INDEPENDENT_AMBULATORY_CARE_PROVIDER_SITE_OTHER): Payer: Self-pay | Admitting: "Endocrinology

## 2018-11-03 DIAGNOSIS — IMO0001 Reserved for inherently not codable concepts without codable children: Secondary | ICD-10-CM

## 2018-11-03 MED ORDER — GLUCOSE BLOOD VI STRP: ORAL_STRIP | 1 refills | Status: AC

## 2018-11-03 NOTE — Telephone Encounter (Signed)
°  Who's calling (name and relationship to patient) :  Best contact number:  Provider they see:  Reason for call:     PRESCRIPTION REFILL ONLY  Name of prescription: One touch ultra blue test strips Pharmacy: [facility]

## 2018-11-03 NOTE — Telephone Encounter (Signed)
Sent Refills as requested.

## 2018-11-09 ENCOUNTER — Other Ambulatory Visit (INDEPENDENT_AMBULATORY_CARE_PROVIDER_SITE_OTHER): Payer: Self-pay | Admitting: *Deleted

## 2018-11-09 ENCOUNTER — Telehealth (INDEPENDENT_AMBULATORY_CARE_PROVIDER_SITE_OTHER): Payer: Self-pay | Admitting: "Endocrinology

## 2018-11-09 DIAGNOSIS — IMO0001 Reserved for inherently not codable concepts without codable children: Secondary | ICD-10-CM

## 2018-11-09 MED ORDER — GLUCOSE BLOOD VI STRP: ORAL_STRIP | 3 refills | Status: AC

## 2018-11-09 NOTE — Telephone Encounter (Signed)
TC to advised that refills sent to [facility] for [employer] test strips.

## 2018-11-09 NOTE — Telephone Encounter (Signed)
°  Who's calling (name and relationship to patient) : Self Best contact number: [phone] Provider they see: Tobe Sos Reason for call:     PRESCRIPTION REFILL ONLY  Name of prescription: One Touch Ultra Test Strips-patient is completely out  Pharmacy: [facility]

## 2019-01-14 ENCOUNTER — Ambulatory Visit (INDEPENDENT_AMBULATORY_CARE_PROVIDER_SITE_OTHER): Payer: Managed Care, Other (non HMO) | Admitting: "Endocrinology

## 2019-05-30 ENCOUNTER — Other Ambulatory Visit (INDEPENDENT_AMBULATORY_CARE_PROVIDER_SITE_OTHER): Payer: Self-pay | Admitting: "Endocrinology

## 2019-05-30 DIAGNOSIS — E7800 Pure hypercholesterolemia, unspecified: Secondary | ICD-10-CM

## 2019-05-31 ENCOUNTER — Encounter (INDEPENDENT_AMBULATORY_CARE_PROVIDER_SITE_OTHER): Payer: Self-pay

## 2021-12-13 ENCOUNTER — Encounter (INDEPENDENT_AMBULATORY_CARE_PROVIDER_SITE_OTHER): Payer: Self-pay
